# Patient Record
Sex: Male | Born: 1965 | Race: White | Hispanic: No | Marital: Married | Smoking: Current some day smoker
Health system: Southern US, Community
[De-identification: ages and names within clinical notes are randomized; demographics above are authoritative.]

## PROBLEM LIST (undated history)

## (undated) HISTORY — PX: HERNIA REPAIR: SHX51

---

## 1997-12-21 ENCOUNTER — Emergency Department (HOSPITAL_COMMUNITY): Admission: EM | Admit: 1997-12-21 | Discharge: 1997-12-21 | Payer: Self-pay | Admitting: Emergency Medicine

## 1998-07-10 ENCOUNTER — Emergency Department (HOSPITAL_COMMUNITY): Admission: EM | Admit: 1998-07-10 | Discharge: 1998-07-10 | Payer: Self-pay | Admitting: Emergency Medicine

## 1998-07-31 ENCOUNTER — Encounter: Payer: Self-pay | Admitting: Internal Medicine

## 1998-07-31 ENCOUNTER — Emergency Department (HOSPITAL_COMMUNITY): Admission: EM | Admit: 1998-07-31 | Discharge: 1998-07-31 | Payer: Self-pay | Admitting: Internal Medicine

## 1998-09-14 ENCOUNTER — Emergency Department (HOSPITAL_COMMUNITY): Admission: EM | Admit: 1998-09-14 | Discharge: 1998-09-14 | Payer: Self-pay | Admitting: Emergency Medicine

## 1999-03-28 ENCOUNTER — Emergency Department (HOSPITAL_COMMUNITY): Admission: EM | Admit: 1999-03-28 | Discharge: 1999-03-28 | Payer: Self-pay | Admitting: Emergency Medicine

## 1999-03-28 ENCOUNTER — Encounter: Payer: Self-pay | Admitting: Emergency Medicine

## 2000-05-07 ENCOUNTER — Emergency Department (HOSPITAL_COMMUNITY): Admission: EM | Admit: 2000-05-07 | Discharge: 2000-05-07 | Payer: Self-pay

## 2000-11-15 ENCOUNTER — Ambulatory Visit (HOSPITAL_BASED_OUTPATIENT_CLINIC_OR_DEPARTMENT_OTHER): Admission: RE | Admit: 2000-11-15 | Discharge: 2000-11-15 | Payer: Self-pay | Admitting: General Surgery

## 2002-02-15 ENCOUNTER — Emergency Department (HOSPITAL_COMMUNITY): Admission: EM | Admit: 2002-02-15 | Discharge: 2002-02-15 | Payer: Self-pay | Admitting: Emergency Medicine

## 2002-03-23 ENCOUNTER — Emergency Department (HOSPITAL_COMMUNITY): Admission: EM | Admit: 2002-03-23 | Discharge: 2002-03-23 | Payer: Self-pay | Admitting: Emergency Medicine

## 2002-03-26 ENCOUNTER — Encounter: Admission: RE | Admit: 2002-03-26 | Discharge: 2002-03-26 | Payer: Self-pay | Admitting: *Deleted

## 2002-10-09 ENCOUNTER — Encounter: Payer: Self-pay | Admitting: Emergency Medicine

## 2002-10-09 ENCOUNTER — Emergency Department (HOSPITAL_COMMUNITY): Admission: EM | Admit: 2002-10-09 | Discharge: 2002-10-09 | Payer: Self-pay | Admitting: Emergency Medicine

## 2003-07-24 ENCOUNTER — Emergency Department (HOSPITAL_COMMUNITY): Admission: EM | Admit: 2003-07-24 | Discharge: 2003-07-24 | Payer: Self-pay | Admitting: Emergency Medicine

## 2004-02-28 ENCOUNTER — Emergency Department (HOSPITAL_COMMUNITY): Admission: EM | Admit: 2004-02-28 | Discharge: 2004-02-28 | Payer: Self-pay | Admitting: Emergency Medicine

## 2006-01-09 ENCOUNTER — Emergency Department (HOSPITAL_COMMUNITY): Admission: EM | Admit: 2006-01-09 | Discharge: 2006-01-09 | Payer: Self-pay | Admitting: Emergency Medicine

## 2006-05-29 ENCOUNTER — Emergency Department (HOSPITAL_COMMUNITY): Admission: EM | Admit: 2006-05-29 | Discharge: 2006-05-30 | Payer: Self-pay | Admitting: Emergency Medicine

## 2007-07-12 ENCOUNTER — Emergency Department (HOSPITAL_COMMUNITY): Admission: EM | Admit: 2007-07-12 | Discharge: 2007-07-12 | Payer: Self-pay | Admitting: Emergency Medicine

## 2007-12-31 ENCOUNTER — Emergency Department (HOSPITAL_COMMUNITY): Admission: EM | Admit: 2007-12-31 | Discharge: 2007-12-31 | Payer: Self-pay | Admitting: Emergency Medicine

## 2009-02-09 ENCOUNTER — Emergency Department (HOSPITAL_COMMUNITY): Admission: EM | Admit: 2009-02-09 | Discharge: 2009-02-09 | Payer: Self-pay | Admitting: Emergency Medicine

## 2009-04-29 ENCOUNTER — Emergency Department (HOSPITAL_BASED_OUTPATIENT_CLINIC_OR_DEPARTMENT_OTHER): Admission: EM | Admit: 2009-04-29 | Discharge: 2009-04-30 | Payer: Self-pay | Admitting: Emergency Medicine

## 2009-04-29 ENCOUNTER — Ambulatory Visit: Payer: Self-pay | Admitting: Radiology

## 2009-05-03 ENCOUNTER — Emergency Department (HOSPITAL_BASED_OUTPATIENT_CLINIC_OR_DEPARTMENT_OTHER): Admission: EM | Admit: 2009-05-03 | Discharge: 2009-05-03 | Payer: Self-pay | Admitting: Emergency Medicine

## 2010-02-23 IMAGING — CT CT CERVICAL SPINE W/O CM
3 of 4 series · 13 of 33 positions shown, 16 images · non-contrast
Comparison: None available

CLINICAL DATA: Status post fall.  The posterior neck pain.

CT CERVICAL SPINE WITHOUT CONTRAST
TECHNIQUE: Multidetector CT imaging of the cervical spine was
performed. Multiplanar CT image reconstructions were also
generated.

[Series 3: c_spine 2.0 b41s st · axial · 0.24mm/px · z∈[-264,-152]mm · 5 of 84 slices shown, 7 images]
[im 14/84  soft-tissue]
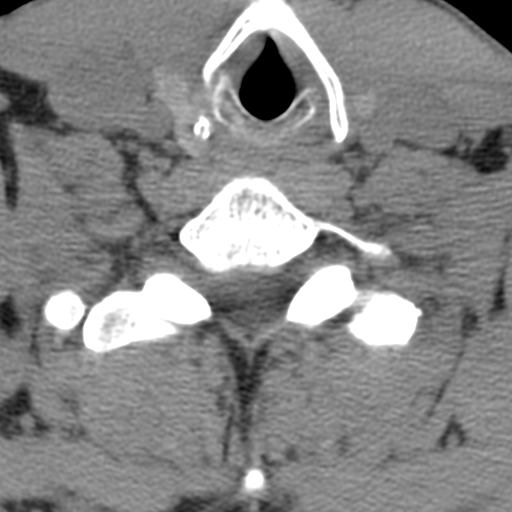
[im 14/84  bone]
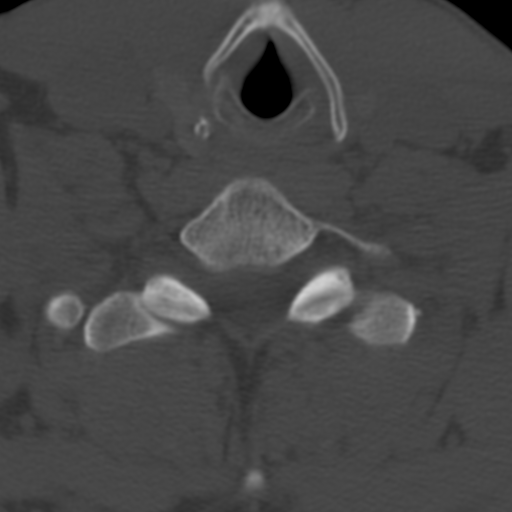
[im 28/84  bone]
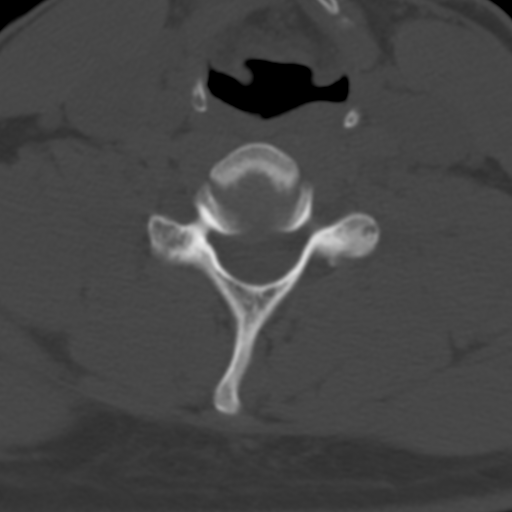
[im 42/84  bone]
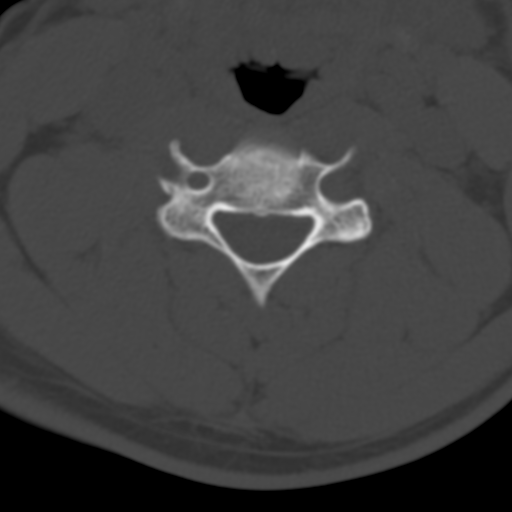
[im 56/84  bone]
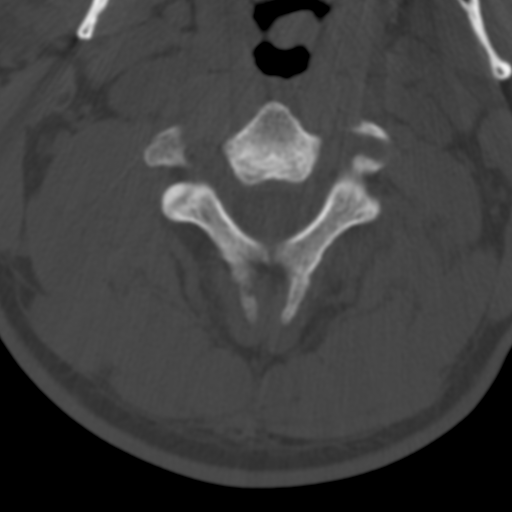
[im 70/84  soft-tissue]
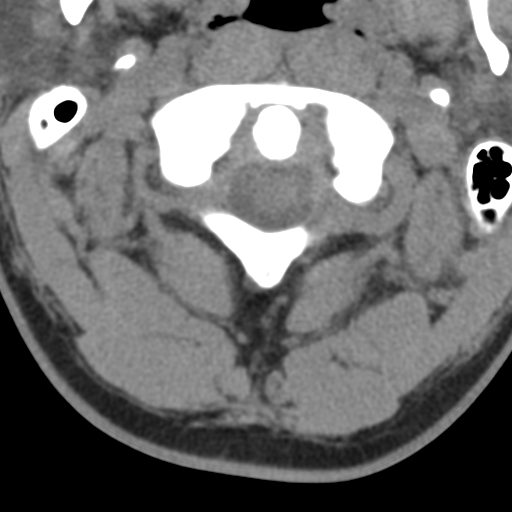
[im 70/84  bone]
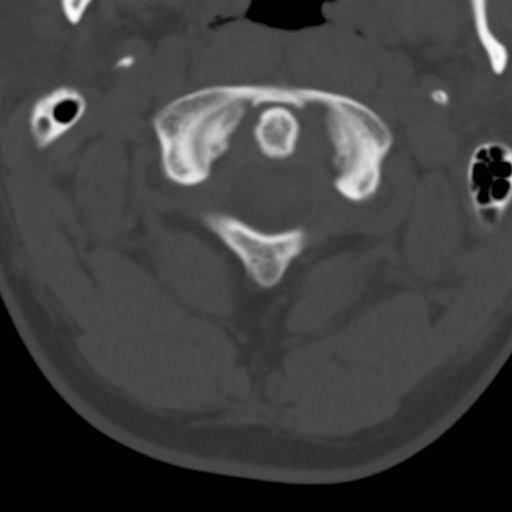

[Series 6: c_spine 2.0 coronal · coronal · 0.24mm/px · 3 of 53 slices shown]
[im 11/53  bone]
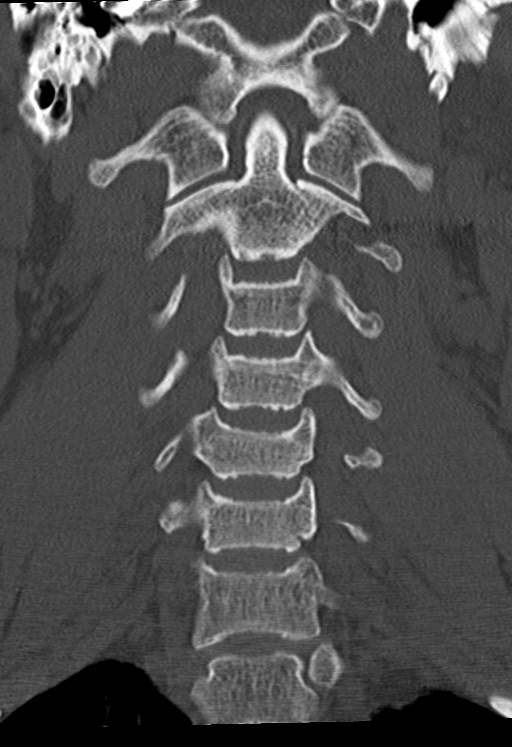
[im 21/53  bone]
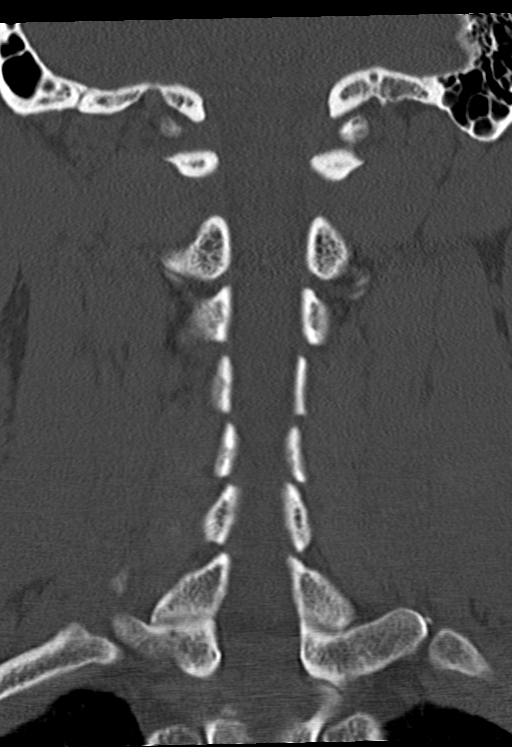
[im 32/53  bone]
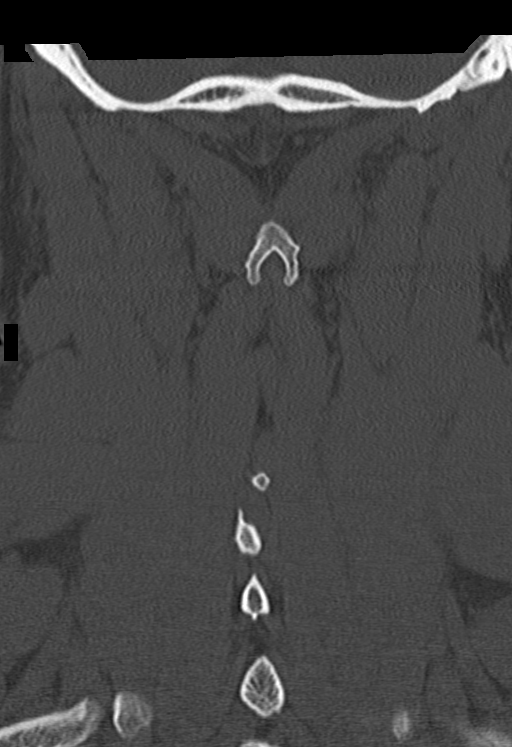

[Series 7: c_spine 2.0 sagittal · sagittal · 0.25mm/px · 5 of 51 slices shown, 6 images]
[im 17/51  bone]
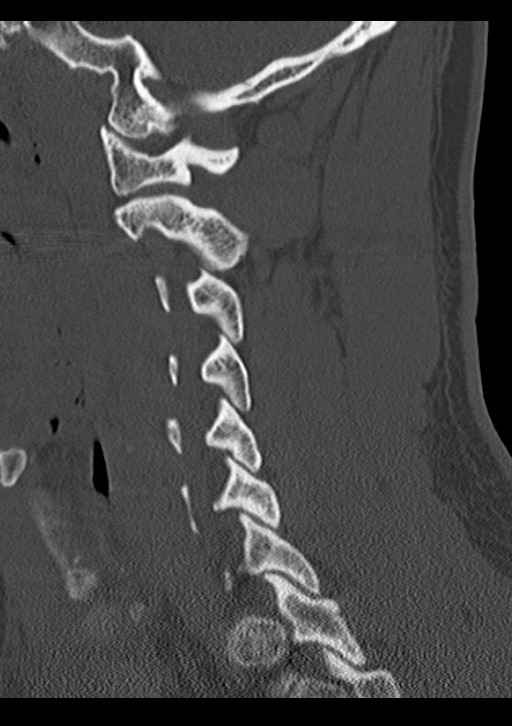
[im 21/51  bone]
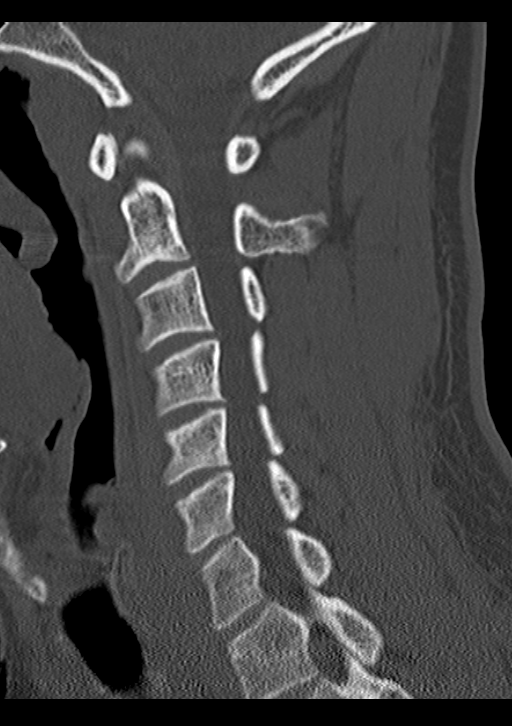
[im 26/51  soft-tissue]
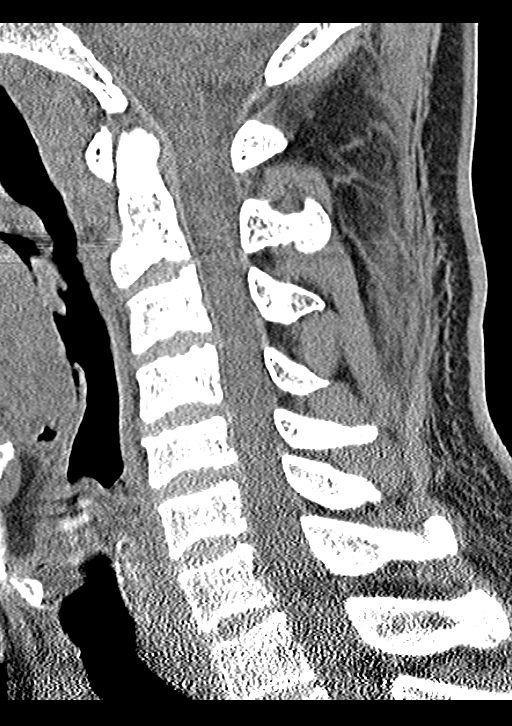
[im 26/51  bone]
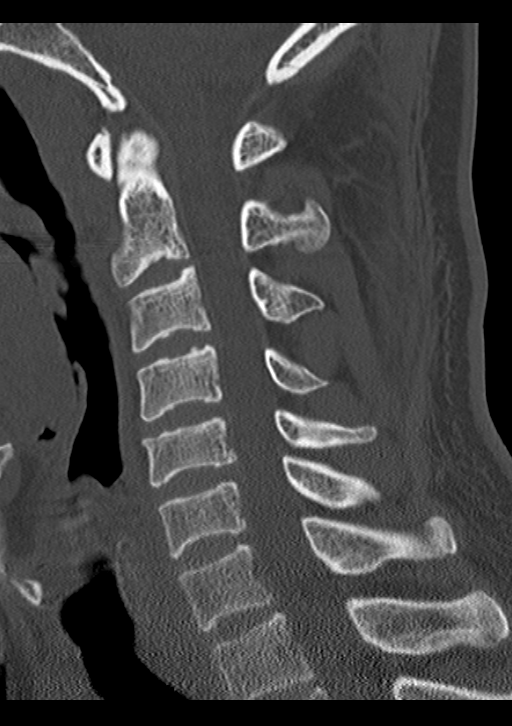
[im 30/51  bone]
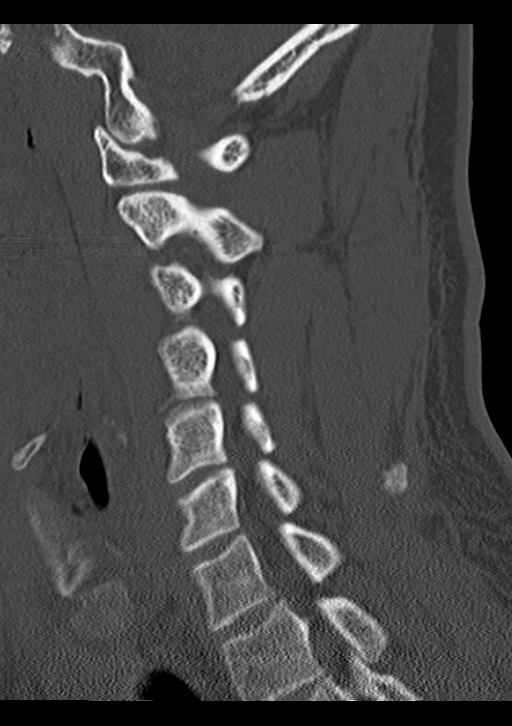
[im 34/51  bone]
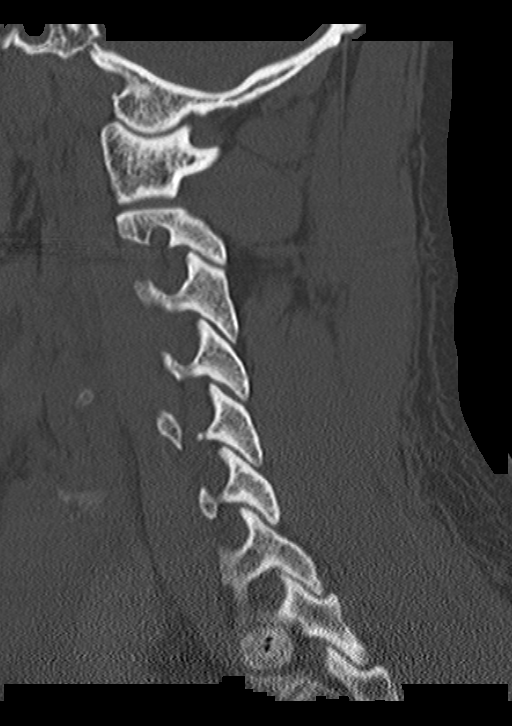

[13 of 33 positions shown; findings below may reference images not displayed]

FINDINGS: The cervical spine is imaged from skull base through the
inferior aspect of T1.  There is chronic loss of vertebral body
height at C5 and to a lesser extent at C4 and C6.  There is no
evidence for acute fracture or traumatic subluxation.  Alignment is
anatomic.  The soft tissues are within normal limits.
IMPRESSION: 1.  No acute fracture or traumatic subluxation.
2.  Minimal degenerative change.

## 2010-11-12 LAB — DIFFERENTIAL
Basophils Absolute: 0.1 10*3/uL (ref 0.0–0.1)
Basophils Relative: 1 % (ref 0–1)
Monocytes Relative: 10 % (ref 3–12)
Neutro Abs: 4.5 10*3/uL (ref 1.7–7.7)
Neutrophils Relative %: 52 % (ref 43–77)

## 2010-11-12 LAB — CBC
MCHC: 35.3 g/dL (ref 30.0–36.0)
Platelets: 269 10*3/uL (ref 150–400)
RBC: 4.56 MIL/uL (ref 4.22–5.81)

## 2010-11-12 LAB — BASIC METABOLIC PANEL
BUN: 18 mg/dL (ref 6–23)
CO2: 30 mEq/L (ref 19–32)
Calcium: 9.7 mg/dL (ref 8.4–10.5)
Creatinine, Ser: 1.3 mg/dL (ref 0.4–1.5)
GFR calc Af Amer: >60 mL/min (ref >60)
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
## 2010-12-24 NOTE — Op Note (Signed)
[facility]  Patient:    Frank Tyler, Frank Tyler                  MRN: [id number] Proc. Date: 11/15/00 Adm. Date:  11/15/00 Attending:  Marnee Spring. Wiliam Ke, M.D.                           Operative Report  PREOPERATIVE DIAGNOSIS:  Recurrent right inguinal hernia.  POSTOPERATIVE DIAGNOSIS:  Recurrent right inguinal hernia.  OPERATION:  Repair of recurrent right inguinal hernia.  SURGEON:  Marnee Spring. Wiliam Ke, M.D.  ASSISTANT:  None.  ANESTHESIA:  General by hospital.  INDICATIONS:  DESCRIPTION OF PROCEDURE:  Under good sedation, the skin of the groin was prepped and draped in the usual manner.  The previously used incision was reopened.  The external ring was identified.  The external oblique aponeurosis was opened thus entering the inguinal canal.  The ilioinguinal nerve was identified and spared.  The spermatic cord was held aside with a Penrose drain.  There was no indirect hernia but there was a sizeable direct hernia. This was repaired with a Marlex patch.  The patch was fashioned and sewn in place with 2 running 2-0 Novofil sutures, both starting at pubic tubercle, one running up conjoint tendon, one running up shelving portion of Pouparts ligament.  Both were tied at the internal ring and then a collaring suture of 2-0 Novofil was used.  Hemostasis was good.  The external oblique aponeurosis was closed superficial to the cord with 2-0 Vicryl, 3-0 Vicryl was used in Scarpas fascia.  Skin was closed with subcuticular 4-0 Vicryl and Steri-Strips applied.  ESTIMATED BLOOD LOSS:  Minimal.  FLUIDS REPLACED:  The patient received no blood.  DISPOSITION:  He left the operating room in satisfactory condition after sponge and needle counts were verified and the testicle was found to be well in the scrotum. DD:  11/15/00 TD:  11/15/00 Job: [id number]

## 2011-09-27 ENCOUNTER — Encounter (HOSPITAL_BASED_OUTPATIENT_CLINIC_OR_DEPARTMENT_OTHER): Payer: Self-pay | Admitting: *Deleted

## 2011-09-27 ENCOUNTER — Emergency Department (HOSPITAL_BASED_OUTPATIENT_CLINIC_OR_DEPARTMENT_OTHER)
Admission: EM | Admit: 2011-09-27 | Discharge: 2011-09-27 | Disposition: A | Discharge status: Home / Self Care | Payer: Self-pay | Attending: Emergency Medicine | Admitting: Emergency Medicine

## 2011-09-27 DIAGNOSIS — R109 Unspecified abdominal pain: Secondary | ICD-10-CM | POA: Insufficient documentation

## 2011-09-27 DIAGNOSIS — F1010 Alcohol abuse, uncomplicated: Secondary | ICD-10-CM | POA: Insufficient documentation

## 2011-09-27 MED ORDER — GABAPENTIN 300 MG PO CAPS: ORAL_CAPSULE | ORAL | Status: DC

## 2011-09-27 NOTE — ED Notes (Signed)
Pt amb to room 3 with quick steady gait in nad. Pt reports 3 months of feeling like his hernia surgical repair "is poking me". abd is soft, non-tender to palp, pt asks this rn to palpate one particular area and states "can you feel the corner of the mesh sticking out?" no foreign bodies or firmness felt by this rn with palpation. Pt denies any fevers, pain, n/v/d or other c/o.

## 2011-09-27 NOTE — ED Provider Notes (Signed)
History     CSN: [id number]  Arrival date & time 09/27/11  1206   First MD Initiated Contact with Patient 09/27/11 1219      Chief Complaint  Patient presents with  . Abdominal Cramping     The history is provided by the patient.   the patient reports chronic pain in his right lower groin since having hernia surgery 1996.  He reports he has discomfort even with light touch of his right lower quadrant.  He reports when his children touch his right lower quadrant or worsened.  It seems to hurt.  He denies nausea and vomiting.  His had no diarrhea.  He denies fever and chills.  He reports his hernia is repaired with mesh.  He saw a general surgeon along time ago he thought it was secondary to nerve root growth issues.  He has not seen anyone in a very long time regarding this.  He does not have a primary care Dr.  He smokes marijuana night to help with the pain so that he can sleep.  He does not like taking pain pills.  His discomfort at this time is minimal.  He denies rash.  He denies testicular pain or blood in his ejaculate.  He reports sometimes when having intercourse is unable to ejaculate.  History reviewed. No pertinent past medical history.  History reviewed. No pertinent past surgical history.  History reviewed. No pertinent family history.  History  Substance Use Topics  . Smoking status: Never Smoker   . Smokeless tobacco: Not on file  . Alcohol Use: Yes      Review of Systems  All other systems reviewed and are negative.    Allergies  Penicillins  Home Medications   Current Outpatient Rx  Name Route Sig Dispense Refill  . GABAPENTIN 300 MG PO CAPS  1 cap PO qday x 1 week, 1 cap PO BID x 1 week, then 1 cap PO TID 120 capsule 1    BP 130/81  Pulse 89  Temp(Src) 98.4 F (36.9 C) (Oral)  Resp 20  SpO2 99%  Physical Exam  Nursing note and vitals reviewed. Constitutional: He is oriented to person, place, and time. He appears well-developed and  well-nourished.  HENT:  Head: Normocephalic and atraumatic.  Eyes: EOM are normal.  Neck: Normal range of motion.  Pulmonary/Chest: No respiratory distress.  Abdominal: Soft. He exhibits no distension. There is no tenderness.  Genitourinary:       Normal circumcised penis.  Normal testicles.  No testicular mass.  No right inguinal mass.  No hernias palpated.  Healed hernia scar in right lower quadrant  Musculoskeletal: Normal range of motion.  Neurological: He is alert and oriented to person, place, and time.  Skin: Skin is warm and dry.  Psychiatric: He has a normal mood and affect. Judgment normal.    ED Course  Procedures (including critical care time)  Labs Reviewed - No data to display No results found.   1. Abdominal pain       MDM  Patient does have chronic pain in his right groin since his hernia surgery approximately 15 years ago.  A lot of his pain sounds neuropathic in nature.  There is no hernia on exam.  The patient will be started on Neurontin.  I will give the patient general surgery followup as he does have mesh in his abdomen.  He reports the pain has been present for several years and is only worsened over the past  4-6 months to the point where he needs to smoke marijuana at night to help him sleep.        Lyanne Co, MD 09/27/11 1300

## 2011-09-27 NOTE — Discharge Instructions (Signed)

## 2011-10-05 ENCOUNTER — Encounter (INDEPENDENT_AMBULATORY_CARE_PROVIDER_SITE_OTHER): Payer: Self-pay | Admitting: Surgery

## 2011-10-11 ENCOUNTER — Ambulatory Visit (INDEPENDENT_AMBULATORY_CARE_PROVIDER_SITE_OTHER): Payer: Self-pay | Admitting: Surgery

## 2011-10-25 ENCOUNTER — Encounter (INDEPENDENT_AMBULATORY_CARE_PROVIDER_SITE_OTHER): Payer: Self-pay | Admitting: Surgery

## 2016-03-07 ENCOUNTER — Emergency Department (HOSPITAL_BASED_OUTPATIENT_CLINIC_OR_DEPARTMENT_OTHER)
Admission: EM | Admit: 2016-03-07 | Discharge: 2016-03-07 | Disposition: A | Discharge status: Home / Self Care | Payer: Self-pay | Attending: Emergency Medicine | Admitting: Emergency Medicine

## 2016-03-07 ENCOUNTER — Encounter (HOSPITAL_BASED_OUTPATIENT_CLINIC_OR_DEPARTMENT_OTHER): Payer: Self-pay | Admitting: *Deleted

## 2016-03-07 DIAGNOSIS — M5417 Radiculopathy, lumbosacral region: Secondary | ICD-10-CM | POA: Insufficient documentation

## 2016-03-07 MED ORDER — PREDNISONE 20 MG PO TABS: ORAL_TABLET | ORAL | 0 refills | Status: DC

## 2016-03-07 MED ORDER — NAPROXEN 500 MG PO TABS: 500.0000 mg | ORAL_TABLET | Freq: Two times a day (BID) | ORAL | 0 refills | Status: DC

## 2016-03-07 MED ORDER — CYCLOBENZAPRINE HCL 10 MG PO TABS: 10.0000 mg | ORAL_TABLET | Freq: Two times a day (BID) | ORAL | 0 refills | Status: DC | PRN

## 2016-03-07 NOTE — ED Triage Notes (Signed)
Back pain with sharp stabbing pain down his left leg. Pain started after doing flips off the diving board.

## 2016-03-07 NOTE — ED Provider Notes (Signed)
[facility]   CSN: [id number] Arrival date & time: 03/07/16  2057  First Provider Contact:   First MD Initiated Contact with Patient 03/07/16 2240      By signing my name below, I, Soijett Blue, attest that this documentation has been prepared under the direction and in the presence of Fayrene Helper, PA-C Electronically Signed: Soijett Blue, ED Scribe. 03/07/16. 10:49 PM.   History   Chief Complaint Chief Complaint  Patient presents with  . Back Pain    HPI Frank Tyler is a 50 y.o. male who presents to the Emergency Department complaining of intermittent back pain onset 9 days. Pt notes that he went diving off a diving board several times prior to the onset of his symptoms. Pt reports that he had gradual onset of lower back pain the following morning. Pt notes that his lower back pain radiates to his left leg and he describes his lower back pain as a stabbing sensation. Pt states that he back pain is worsened with prolonged sitting and alleviated with ambulation.   He states that he has tried flexeril, Roxicodone, naprosyn with no relief for his symptoms. Pt notes that he obtain the medications that he used from his brothers. Pt denies bowel/bladder incontinence, fever, gait problem, color change, rash, wound, and any other symptoms, Denies CA or IV drug use.   The history is provided by the patient. No language interpreter was used.    History reviewed. No pertinent past medical history.  There are no active problems to display for this patient.   Past Surgical History:  Procedure Laterality Date  . HERNIA REPAIR     RIH  . HERNIA REPAIR     as a child       Home Medications    Prior to Admission medications   Medication Sig Start Date End Date Taking? Authorizing Provider  gabapentin (NEURONTIN) 300 MG capsule 1 cap PO qday x 1 week, 1 cap PO BID x 1 week, then 1 cap PO TID 09/27/11   Azalia Bilis, MD    Family History No family  history on file.  Social History Social History  Substance Use Topics  . Smoking status: Never Smoker  . Smokeless tobacco: Not on file  . Alcohol use Yes     Allergies   Penicillins   Review of Systems Review of Systems  Gastrointestinal:       No bowel incontinence.  Genitourinary:       No bladder incontinence.  Musculoskeletal: Positive for back pain. Negative for gait problem.  Skin: Negative for color change.     Physical Exam Updated Vital Signs BP 149/97   Pulse 83   Temp 98.2 F (36.8 C) (Oral)   Resp 20   Ht 5\' 11"  (1.803 m)   Wt 194 lb (88 kg)   SpO2 98%   BMI 27.06 kg/m   Physical Exam  Constitutional: He is oriented to person, place, and time. He appears well-developed and well-nourished. No distress.  HENT:  Head: Normocephalic and atraumatic.  Eyes: EOM are normal.  Neck: Neck supple.  Cardiovascular: Normal rate, regular rhythm and normal heart sounds.  Exam reveals no gallop and no friction rub.   No murmur heard. Pulmonary/Chest: Effort normal and breath sounds normal. No respiratory distress. He has no wheezes. He has no rales.  Abdominal: Soft. He exhibits no distension. There is no tenderness. There is no CVA tenderness.  No CVA tenderness  Musculoskeletal: Normal  range of motion. He exhibits tenderness.       Cervical back: Normal.       Thoracic back: Normal.       Lumbar back: Normal.  No significant midline spinal tenderness, crepitus, or step-offs. Tenderness noted to left lumbosacral region on palpation with +SLR on left side. Dorsalis pedis pulses palpable bilaterally.   Neurological: He is alert and oriented to person, place, and time. Gait normal.  Patellar reflexes intact bilaterally. Able to ambulate without difficulty or assistance.   Skin: Skin is warm and dry.  Psychiatric: He has a normal mood and affect. His behavior is normal.  Nursing note and vitals reviewed.    ED Treatments / Results  DIAGNOSTIC STUDIES: Oxygen  Saturation is 98% on RA, nl by my interpretation.    COORDINATION OF CARE: 10:47 PM Discussed treatment plan with pt at bedside which includes naprosyn Rx, steroid Rx, referral and follow up with orthopedist, and pt agreed to plan.   Procedures Procedures (including critical care time)  Medications Ordered in ED Medications - No data to display   Initial Impression / Assessment and Plan / ED Course  I have reviewed the triage vital signs and the nursing notes.  Pertinent labs & imaging results that were available during my care of the patient were reviewed by me and considered in my medical decision making (see chart for details).  Clinical Course    BP 140/92 (BP Location: Right Arm)   Pulse 92   Temp 98.2 F (36.8 C) (Oral)   Resp 18   Ht  (1.803 m)   Wt 88 kg   SpO2 99%   BMI 27.06 kg/m    Final Clinical Impressions(s) / ED Diagnoses   Final diagnoses:  Lumbosacral radiculopathy    New Prescriptions Discharge Medication List as of 03/07/2016 10:53 PM    START taking these medications   Details  cyclobenzaprine (FLEXERIL) 10 MG tablet Take 1 tablet (10 mg total) by mouth 2 (two) times daily as needed for muscle spasms., Starting Mon 03/07/2016, Print    naproxen (NAPROSYN) 500 MG tablet Take 1 tablet (500 mg total) by mouth 2 (two) times daily., Starting Mon 03/07/2016, Print    predniSONE (DELTASONE) 20 MG tablet 3 tabs po day one, then 2 tabs daily x 4 days, Print        I personally performed the services described in this documentation, which was scribed in my presence. The recorded information has been reviewed and is accurate.       Fayrene Helper, PA-C 03/08/16 1607    Rolland Porter, MD 03/19/16 2234

## 2017-10-08 ENCOUNTER — Encounter (HOSPITAL_BASED_OUTPATIENT_CLINIC_OR_DEPARTMENT_OTHER): Payer: Self-pay | Admitting: *Deleted

## 2017-10-08 ENCOUNTER — Other Ambulatory Visit: Payer: Self-pay

## 2017-10-08 DIAGNOSIS — Y999 Unspecified external cause status: Secondary | ICD-10-CM | POA: Insufficient documentation

## 2017-10-08 DIAGNOSIS — Y92018 Other place in single-family (private) house as the place of occurrence of the external cause: Secondary | ICD-10-CM | POA: Insufficient documentation

## 2017-10-08 DIAGNOSIS — F17290 Nicotine dependence, other tobacco product, uncomplicated: Secondary | ICD-10-CM | POA: Insufficient documentation

## 2017-10-08 DIAGNOSIS — W34010A Accidental discharge of airgun, initial encounter: Secondary | ICD-10-CM | POA: Insufficient documentation

## 2017-10-08 DIAGNOSIS — Y9389 Activity, other specified: Secondary | ICD-10-CM | POA: Insufficient documentation

## 2017-10-08 DIAGNOSIS — S40852A Superficial foreign body of left upper arm, initial encounter: Secondary | ICD-10-CM | POA: Insufficient documentation
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
## 2017-10-09 ENCOUNTER — Emergency Department (HOSPITAL_BASED_OUTPATIENT_CLINIC_OR_DEPARTMENT_OTHER)
Admission: EM | Admit: 2017-10-09 | Discharge: 2017-10-09 | Disposition: A | Discharge status: Home / Self Care | Payer: Self-pay | Attending: Emergency Medicine | Admitting: Emergency Medicine

## 2017-10-09 DIAGNOSIS — S40852A Superficial foreign body of left upper arm, initial encounter: Secondary | ICD-10-CM

## 2017-10-09 NOTE — ED Notes (Signed)
Pt given d/c instructions as per chart. Verbalizes understanding. No questions. 

## 2017-10-09 NOTE — ED Notes (Signed)
ED Provider at bedside. 
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
## 2018-03-13 ENCOUNTER — Other Ambulatory Visit: Payer: Self-pay

## 2018-03-13 ENCOUNTER — Encounter (HOSPITAL_BASED_OUTPATIENT_CLINIC_OR_DEPARTMENT_OTHER): Payer: Self-pay | Admitting: *Deleted

## 2018-03-13 ENCOUNTER — Emergency Department (HOSPITAL_BASED_OUTPATIENT_CLINIC_OR_DEPARTMENT_OTHER)
Admission: EM | Admit: 2018-03-13 | Discharge: 2018-03-13 | Disposition: A | Discharge status: Home / Self Care | Payer: Self-pay | Attending: Emergency Medicine | Admitting: Emergency Medicine

## 2018-03-13 DIAGNOSIS — M5442 Lumbago with sciatica, left side: Secondary | ICD-10-CM | POA: Insufficient documentation

## 2018-03-13 DIAGNOSIS — F17200 Nicotine dependence, unspecified, uncomplicated: Secondary | ICD-10-CM | POA: Insufficient documentation

## 2018-03-13 DIAGNOSIS — Z79899 Other long term (current) drug therapy: Secondary | ICD-10-CM | POA: Insufficient documentation

## 2018-03-13 MED ORDER — PREDNISONE 10 MG PO TABS: 30.0000 mg | ORAL_TABLET | Freq: Every day | ORAL | 0 refills | Status: AC

## 2018-03-13 MED ORDER — METHOCARBAMOL 500 MG PO TABS: 500.0000 mg | ORAL_TABLET | Freq: Two times a day (BID) | ORAL | 0 refills | Status: AC

## 2018-03-13 MED ORDER — NAPROXEN 500 MG PO TABS: 500.0000 mg | ORAL_TABLET | Freq: Two times a day (BID) | ORAL | 0 refills | Status: AC

## 2018-03-13 MED FILL — NAPROXEN 500 MG TABLET: 500 | 7 days supply | Qty: 14 | Fill #0

## 2018-03-13 MED FILL — METHOCARBAMOL 500 MG TABLET: 500 | 5 days supply | Qty: 10 | Fill #0

## 2018-03-13 MED FILL — predniSONE 10 MG TABS: 10 | 5 days supply | Qty: 15 | Fill #0

## 2018-03-13 NOTE — ED Notes (Signed)
ED Provider at bedside. 

## 2018-03-13 NOTE — ED Provider Notes (Signed)
[facility] EMERGENCY DEPARTMENT Provider Note   CSN: [id number] Arrival date & time: 03/13/18  1345     History   Chief Complaint Chief Complaint  Patient presents with  . Back Pain    HPI Frank Tyler is a 52 y.o. male presenting for left leg pain for the past 3 weeks.  Patient states that the pain began the day after he went out golfing, he describes it as a shooting pain that radiates from his left gluteal muscle all the way down to his left foot.  Patient denies injury/falls.  Patient states that he has not taken any medication for his pain but did go get a massage which he feels made the pain worse.  He describes his pain as 7/10 in severity at this time made worse with ambulation.   Patient denies history of fever, trauma, leg swelling/color change, bowel or bladder incontinence, saddle area paresthesias.  Patient states that he has had similar pain in the past approximately 2-3 years ago.  He states at that time he was prescribed medicines in the emergency department which helped with his pain.  I have reviewed the patient's chart which shows seen on 03/07/2016 for similar symptoms prescribed muscle relaxer, naproxen and prednisone.  HPI  History reviewed. No pertinent past medical history.  There are no active problems to display for this patient.   Past Surgical History:  Procedure Laterality Date  . HERNIA REPAIR     RIH  . HERNIA REPAIR     as a child        Home Medications    Prior to Admission medications   Medication Sig Start Date End Date Taking? Authorizing Provider  gabapentin (NEURONTIN) 300 MG capsule 1 cap PO qday x 1 week, 1 cap PO BID x 1 week, then 1 cap PO TID 09/27/11   Azalia Bilis, MD  methocarbamol (ROBAXIN) 500 MG tablet Take 1 tablet (500 mg total) by mouth 2 (two) times daily. 03/13/18   Harlene Salts A, PA-C  naproxen (NAPROSYN) 500 MG tablet Take 1 tablet (500 mg total) by mouth 2 (two) times daily. 03/13/18   Harlene Salts A, PA-C  predniSONE (DELTASONE) 10 MG tablet Take 3 tablets (30 mg total) by mouth daily for 5 days. 03/13/18 03/18/18  Bill Salinas, PA-C    Family History History reviewed. No pertinent family history.  Social History Social History   Tobacco Use  . Smoking status: Current Every Day Smoker    Packs/day: 0.50  . Smokeless tobacco: Current User  Substance Use Topics  . Alcohol use: Yes    Comment: social  . Drug use: Yes    Types: Marijuana     Allergies   Penicillins   Review of Systems Review of Systems  Constitutional: Negative.  Negative for chills, fatigue and fever.  Genitourinary: Negative.  Negative for dysuria.       Denies bowel or bladder incontinence.  Musculoskeletal: Positive for back pain. Negative for neck pain and neck stiffness.  Skin: Negative.  Negative for color change and wound.  Neurological: Negative.  Negative for weakness, numbness and headaches.     Physical Exam Updated Vital Signs BP (!) 126/92 (BP Location: Left Arm)   Pulse 73   Temp 98.9 F (37.2 C)   Resp 16   Ht 5\' 10"  (1.778 m)   Wt 90.7 kg (200 lb)   SpO2 100%   BMI 28.70 kg/m   Physical Exam  Constitutional: He  is oriented to person, place, and time. He appears well-developed and well-nourished. No distress.  HENT:  Head: Normocephalic and atraumatic.  Right Ear: External ear normal.  Left Ear: External ear normal.  Nose: Nose normal.  Eyes: Pupils are equal, round, and reactive to light. EOM are normal.  Neck: Trachea normal and normal range of motion. No tracheal deviation present.  Pulmonary/Chest: Effort normal. No respiratory distress.  Abdominal: Soft. There is no tenderness. There is no rebound and no guarding.  Musculoskeletal: Normal range of motion.       Cervical back: Normal.       Thoracic back: Normal.       Lumbar back: He exhibits normal range of motion, no bony tenderness, no swelling and no deformity.       Back:  No midline spinal  tenderness to palpation, no paraspinal muscle tenderness, no deformity, crepitus, or step-off noted  Patient with tenderness to palpation of the left gluteal muscle. Positive left-sided straight leg test.  Patient is neurovascularly intact to both lower externally's bilaterally.  Strong and equal pedal pulses.  No lower extremity swelling or color change.  Full sensation to both lower extremities.  5/5 strength to dorsi and plantar flexion as well as extension and flexion of the left knee.  Sensation intact to bilateral lower extremities.  Patient ambulatory without assistance, normal gait.  Neurological: He is alert and oriented to person, place, and time. No sensory deficit.  Skin: Skin is warm and dry.  Psychiatric: He has a normal mood and affect. His behavior is normal.     ED Treatments / Results  Labs (all labs ordered are listed, but only abnormal results are displayed) Labs Reviewed - No data to display  EKG None  Radiology No results found.  Procedures Procedures (including critical care time)  Medications Ordered in ED Medications - No data to display   Initial Impression / Assessment and Plan / ED Course  I have reviewed the triage vital signs and the nursing notes.  Pertinent labs & imaging results that were available during my care of the patient were reviewed by me and considered in my medical decision making (see chart for details).  Patient presenting with left-sided back pain with left-sided sciatica. No neurological deficits and normal neuro exam.  Patient can walk but states is painful.  Patient denies loss of bowel/bladder control or saddle area paresthesias.  No concern for cauda equina.  No fever, night sweats, weight loss, h/o cancer, or IVDU. RICE protocol and pain medicine indicated and discussed with patient.   Naproxen 500mg  BID prescribed. Patient denies history of CKD or gastric ulcers/bleeding. Robaxin 500mg  BID prescribed. Patient informed to  avoid driving or operating heavy machinery while taking muscle relaxer.  Patient also prescribed a short course of prednisone, patient denies history of diabetes, states he has taken prednisone before with no adverse reaction.  At this time there does not appear to be any evidence of an acute emergency medical condition and the patient appears stable for discharge with appropriate outpatient follow up. Diagnosis was discussed with patient who verbalizes understanding of care plan and is agreeable to discharge. I have discussed return precautions with patient who verbalizes understanding of return precautions. Patient strongly encouraged to follow-up with their PCP. All questions answered.  Patient's case discussed with Dr. Jacqulyn Bath who agrees with plan to discharge with follow-up.     Note: Portions of this report may have been transcribed using voice recognition software. Every effort was  made to ensure accuracy; however, inadvertent computerized transcription errors may still be present.       Final Clinical Impressions(s) / ED Diagnoses   Final diagnoses:  Acute left-sided low back pain with left-sided sciatica    ED Discharge Orders        Ordered    naproxen (NAPROSYN) 500 MG tablet  2 times daily     03/13/18 1428    methocarbamol (ROBAXIN) 500 MG tablet  2 times daily     03/13/18 1428    predniSONE (DELTASONE) 10 MG tablet  Daily     03/13/18 1428       Bill SalinasMorelli, Shyloh Krinke A, PA-C 03/13/18 1504    Maia PlanLong, Joshua G, MD 03/13/18 1729

## 2018-03-13 NOTE — ED Triage Notes (Signed)
Pt co left lower back pain x 1 week

## 2018-03-13 NOTE — Discharge Instructions (Addendum)
Please follow-up with your primary care provider regarding your visit today.  If you do not have a primary care provider please use the resources below to establish one. Please return to the emergency department for any new or worsening symptoms or if your symptoms do not improve. Please use the medicine as prescribed to help with your pain.  Please remember not to drive or operate heavy machinery while taking Robaxin because it will make you sleepy.  Please also drink plenty of water while taking naproxen.  Contact a doctor if: You have pain that: Wakes you up when you are sleeping. Gets worse when you lie down. Is worse than the pain you have had in the past. Lasts longer than 4 weeks. You lose weight for without trying. Get help right away if: You cannot control when you pee (urinate) or poop (have a bowel movement). You have weakness in any of these areas and it gets worse. Lower back. Lower belly (pelvis). Butt (buttocks). Legs. You have redness or swelling of your back. You have a burning feeling when you pee.  RESOURCE GUIDE  Chronic Pain Problems: Contact [facility]  [phone] Patients need to be referred by their primary care doctor.  Insufficient Money for Medicine: Contact United Way:  call "211" or [facility] [phone].  No Primary Care Doctor: Call Health Connect  [phone] - can help you locate a primary care doctor that  accepts your insurance, provides certain services, etc. Physician Referral Service- [phone]  Agencies that provide inexpensive medical care: [facility]  [phone] [facility]  [phone] [facility]  [phone] [facility]  [phone] [facility]  [phone] [facility]  [phone]  Medicaid-accepting [facility] Providers: [facility]- [address], [address]  [phone], Mon-Fri 9am-7pm, Sat 9am-1pm [facility]- [address], [address] [place]  [phone] [facility]- [address], [address] [place]  [phone] [facility]- [address] [place] [address]  [phone] Renaye RakersVeita Bland- [address], [address], [phone]  Only accepts [place] patients after they have their name  applied to their card  Self Pay (no insurance) in [facility]: Sickle Cell Patients: Dr Willey BladeEric Dean, [facility]  [address], [phone] [facility]- [address]  [phone]       Redge Gainer-     [facility]- [address], [address]       -     [facility]- see information above (Speak to CitigroupPam H if you do not have insurance)       -  [facility]- [address], [phone]       -  [facility] [place]- [address],  [phone]       -  [facility]- [address] [place] [address], [phone]       -  Dr Julio Sickssei-Bonsu-  [address], [address], [place], [phone]       -  [facility]- [address], [phone]       -  [facility]- [address] [place] [address], [phone], also [address], [phone]       -    [facility]- [address], [phone], 1st & 3rd Saturday   every month, 10am-1pm  1) Find  a Doctor and Pay Out of Pocket Although you won't have to find out who is covered by your insurance plan, it is a good idea to ask around and get recommendations. You will then need to call the office and see if the doctor you have chosen will accept you as a new patient and what types of options they offer for patients who are self-pay. Some doctors offer discounts or will set up payment plans for their patients who do not have insurance, but you will need to ask so you aren't surprised when you get to your appointment.  2) Contact Your Local Health Department Not all health departments have doctors that can see patients for sick visits, but many do, so it is  worth a call to see if yours does. If you don't know where your local health department is, you can check in your phone book. The CDC also has a tool to help you locate your state's health department, and many state websites also have listings of all of their local health departments.  3) Find a Walk-in Clinic If your illness is not likely to be very severe or complicated, you may want to try a walk in clinic. These are popping up all over the country in pharmacies, drugstores, and shopping centers. They're usually staffed by nurse practitioners or physician assistants that have been trained to treat common illnesses and complaints. They're usually fairly quick and inexpensive. However, if you have serious medical issues or chronic medical problems, these are probably not your best option  STD Testing [facility] Department of [facility] [place], STD Clinic, [address], [place], phone [phone] or [phone].  Monday - Friday, call for an appointment. [facility] Department of [facility] [place], STD Clinic, [address], [place], phone [phone] or [phone].  Monday - Friday, call for an appointment.  Abuse/Neglect: [facility] Child Abuse Hotline (913)487-3487(336) [phone])668-0720 [facility] Child Abuse Hotline 682 [phone]) [phone] (After Hours)  Emergency Shelter:  [facility] [phone](336) [phone]  Maternity Homes: Room at the [place] [facility] [phone](336) [phone] [facility] [phone]) [phone]  MRSA Hotline #:   (248)459-1974(743)051-2327  [facility] Resources  [facility] [place] [place]  [employer] Way [facility]. [address].                 [address]         [address]  [place]                                               [place]                              [place] Phone:  [phone]                                  Phone:  [phone]                   Phone:  [phone]  [facility],  [phone] [facility] - [phone]- [phone]       -     [facility] in [place], [address],  [phone], [place] [phone] or [phone] (After Hours)   [place]  Substance Abuse Resources: Alcohol and Drug Services  [place] [phone] The Celoron Chinita Pester [phone] Residential & Outpatient Substance Abuse Program  [phone]  Psychological Services: [place]  [phone] [place]  [place], [place] [address] [place], [place]: [phone] or [phone], PicCapture.uy  Dental Assistance  If unable to pay or uninsured, contact:  [facility] or [facility]. to become qualified for the adult dental clinic.  Patients with Medicaid: [facility] [phone] [address], [place] [address], [phone]  If unable to pay, or uninsured, contact [facility] [phone]) or [employer] [phone] in [place], [place] in [facility]) to become qualified for the adult dental clinic   Other [place]- [place], [place], [place], [address], [place], [place], 2nd and 4th Thursday of the month at 6:30am.  10 clients each day by appointment, can sometimes see walk-in patients if someone does not show for an appointment. [facility]- [address] Hillard Danker [place], [place], [address], [place], [place], [place], [address], [place] Department- [phone] [place] [facility] [phone]

## 2018-03-21 ENCOUNTER — Encounter (HOSPITAL_BASED_OUTPATIENT_CLINIC_OR_DEPARTMENT_OTHER): Payer: Self-pay

## 2018-03-21 ENCOUNTER — Other Ambulatory Visit: Payer: Self-pay

## 2018-03-21 ENCOUNTER — Emergency Department (HOSPITAL_BASED_OUTPATIENT_CLINIC_OR_DEPARTMENT_OTHER)
Admission: EM | Admit: 2018-03-21 | Discharge: 2018-03-21 | Disposition: A | Discharge status: Home / Self Care | Payer: Self-pay | Attending: Emergency Medicine | Admitting: Emergency Medicine

## 2018-03-21 DIAGNOSIS — F17210 Nicotine dependence, cigarettes, uncomplicated: Secondary | ICD-10-CM | POA: Insufficient documentation

## 2018-03-21 DIAGNOSIS — Z79899 Other long term (current) drug therapy: Secondary | ICD-10-CM | POA: Insufficient documentation

## 2018-03-21 DIAGNOSIS — M5442 Lumbago with sciatica, left side: Secondary | ICD-10-CM | POA: Insufficient documentation

## 2018-03-21 MED ORDER — CYCLOBENZAPRINE HCL 10 MG PO TABS
10.0000 mg | ORAL_TABLET | Freq: Once | ORAL | Status: AC
  Administered 2018-03-21: 10 mg via ORAL
  Filled 2018-03-21: qty 1

## 2018-03-21 MED ORDER — PREDNISONE 10 MG PO TABS: 20.0000 mg | ORAL_TABLET | Freq: Every day | ORAL | 0 refills | Status: AC

## 2018-03-21 MED ORDER — KETOROLAC TROMETHAMINE 30 MG/ML IJ SOLN
60.0000 mg | Freq: Once | INTRAMUSCULAR | Status: AC
  Administered 2018-03-21: 60 mg via INTRAMUSCULAR
  Filled 2018-03-21: qty 2

## 2018-03-21 NOTE — Discharge Instructions (Addendum)
You have received an injection of Toradol which is an anti-inflammatory and a muscle relaxer. I have prescribed you steroids to take for the next 5 days to help with the inflammation that is causing the pain. You take this in combination with NSAIDs (Tylenol, ibuprofen) and muscle relaxers.  Thank you for allowing me to take care of you today!

## 2018-03-21 NOTE — ED Notes (Signed)
Pt/family verbalized understanding of discharge instructions.   

## 2018-03-21 NOTE — ED Triage Notes (Signed)
Pt c/o sharp pain in L buttocks and L calf. Pt was seen here 2 weeks ago for same pt was dx with sciatica.

## 2018-03-22 NOTE — ED Provider Notes (Signed)
[facility] EMERGENCY DEPARTMENT Provider Note  CSN: [id number] Arrival date & time: 03/21/18  1815    History   Chief Complaint Chief Complaint  Patient presents with  . Hip Pain    HPI Frank Tyler is a 52 y.o. male with a medical history of low back pain with sciatica who presented to the ED for left side back pain x2 days. Associated symptoms: radiating pain to left buttock and down to calf. Denies recent injury, falls or trauma. He describes the pain as sharp, shooting and worse when the patient straightens his leg. He has relief when the leg is bent. He reports being diagnosed with sciatica and states symptoms relieved after a couple of days of prednisone. Patient has tried nothing prior to coming to the ED this time. Denies fever,  paresthesias, weakness, foot drop, bowel/bladder dysfunction or other arthralgias.  Additional history obtained by medical chart. Patient seen in the ED on 03/13/18 for similar complaint. Diagnosed with low back with sciatica and discharged with Naprosyn, Robaxin and prednisone.   History reviewed. No pertinent past medical history.  There are no active problems to display for this patient.   Past Surgical History:  Procedure Laterality Date  . HERNIA REPAIR     RIH  . HERNIA REPAIR     as a child        Home Medications    Prior to Admission medications   Medication Sig Start Date End Date Taking? Authorizing Provider  gabapentin (NEURONTIN) 300 MG capsule 1 cap PO qday x 1 week, 1 cap PO BID x 1 week, then 1 cap PO TID 09/27/11   Azalia Bilisampos, Kevin, MD  methocarbamol (ROBAXIN) 500 MG tablet Take 1 tablet (500 mg total) by mouth 2 (two) times daily. 03/13/18   Harlene SaltsMorelli, Brandon A, PA-C  naproxen (NAPROSYN) 500 MG tablet Take 1 tablet (500 mg total) by mouth 2 (two) times daily. 03/13/18   Harlene SaltsMorelli, Brandon A, PA-C  predniSONE (DELTASONE) 10 MG tablet Take 2 tablets (20 mg total) by mouth daily for 5 days. 03/21/18 03/26/18  Mortis,  Sharyon MedicusGabrielle I, PA-C    Family History No family history on file.  Social History Social History   Tobacco Use  . Smoking status: Current Some Day Smoker    Packs/day: 0.50  . Smokeless tobacco: Current User  Substance Use Topics  . Alcohol use: Yes    Comment: social  . Drug use: Yes    Types: Marijuana     Allergies   Penicillins   Review of Systems Review of Systems  Constitutional: Negative.   Genitourinary: Negative.   Musculoskeletal: Positive for back pain and gait problem. Negative for neck pain and neck stiffness.  Skin: Negative.   Neurological: Negative for weakness and numbness.     Physical Exam Updated Vital Signs BP 133/74 (BP Location: Left Arm)   Pulse 98   Temp 98.5 F (36.9 C) (Oral)   Resp 18   Ht 5\' 10"  (1.778 m)   Wt 90.7 kg   SpO2 98%   BMI 28.70 kg/m   Physical Exam  Constitutional: Vital signs are normal. He appears well-developed and well-nourished. He is cooperative.  Neck: Normal range of motion and full passive range of motion without pain. Neck supple. No spinous process tenderness and no muscular tenderness present. Normal range of motion present.  Cardiovascular:  Pulses:      Dorsalis pedis pulses are 2+ on the right side, and 2+ on the left side.  Posterior tibial pulses are 2+ on the right side, and 2+ on the left side.  Musculoskeletal:       Cervical back: Normal.       Thoracic back: Normal.       Lumbar back: He exhibits decreased range of motion and tenderness. He exhibits no bony tenderness and no spasm.  Lower extremities have full active and passive ROM with 5/5 strength. Significant pain with hip extension. Positive straight leg test on the left. Able to bear weight and ambulate, but patient limits his gait because of pain with hip extension so limp is seen.  Neurological: He is alert. He has normal strength. No sensory deficit. He exhibits normal muscle tone.  Reflex Scores:      Patellar reflexes are 2+ on  the right side and 2+ on the left side.      Achilles reflexes are 2+ on the right side and 2+ on the left side. Skin: Skin is warm and intact. Capillary refill takes less than 2 seconds.  Nursing note and vitals reviewed.  ED Treatments / Results  Labs (all labs ordered are listed, but only abnormal results are displayed) Labs Reviewed - No data to display  EKG None  Radiology No results found.  Procedures Procedures (including critical care time)  Medications Ordered in ED Medications  ketorolac (TORADOL) 30 MG/ML injection 60 mg (60 mg Intramuscular Given 03/21/18 1929)  cyclobenzaprine (FLEXERIL) tablet 10 mg (10 mg Oral Given 03/21/18 1931)     Initial Impression / Assessment and Plan / ED Course  Triage vital signs and the nursing notes have been reviewed.  Pertinent labs & imaging results that were available during care of the patient were reviewed and considered in medical decision making (see chart for details).   Patient presents for low back pain with symptoms consistent with sciatica. Patient states that he did not ambulate today because of severe 10/10 pain with leg extension. He is found in the exam room laying on the floor with his left leg bent on a chair because that is most comfortable and provides relief for him. Fortunately, there is no midline or spinous process tenderness or recent trauma, falls or injuries that warrants spinal imaging today. He has no neuro complaints, bowel/bladder dysfunction or neuro deficits on exam that raise additional concern for acute spinal cord pathology. Despite pain, patient has good ROM of back and lower extremities bilaterally. There are no other s/s that raise concern of infectious or rheumatologic process.  Final Clinical Impressions(s) / ED Diagnoses  1. Low Back Pain with Left Sided Sciatica. IM Toradol 60mg  x1 given in the ED. Prescribed Deltasone 20mg  x5 days. Education provided on OTC and supportive treatment for pain  relief. 2.  Dispo: Home. After thorough clinical evaluation, this patient is determined to be medically stable and can be safely discharged with the previously mentioned treatment and/or outpatient follow-up/referral(s). At this time, there are no other apparent medical conditions that require further screening, evaluation or treatment.   Final diagnoses:  Acute left-sided low back pain with left-sided sciatica    ED Discharge Orders         Ordered    predniSONE (DELTASONE) 10 MG tablet  Daily     03/21/18 1947            Reva BoresMortis, Gabrielle I, PA-C 03/22/18 1719    Terrilee FilesButler, Michael C, MD 03/23/18 1350

## 2018-03-23 ENCOUNTER — Emergency Department (HOSPITAL_BASED_OUTPATIENT_CLINIC_OR_DEPARTMENT_OTHER)
Admission: EM | Admit: 2018-03-23 | Discharge: 2018-03-23 | Disposition: A | Discharge status: Home / Self Care | Payer: Self-pay | Attending: Emergency Medicine | Admitting: Emergency Medicine

## 2018-03-23 ENCOUNTER — Other Ambulatory Visit: Payer: Self-pay

## 2018-03-23 ENCOUNTER — Encounter (HOSPITAL_BASED_OUTPATIENT_CLINIC_OR_DEPARTMENT_OTHER): Payer: Self-pay | Admitting: *Deleted

## 2018-03-23 DIAGNOSIS — M5432 Sciatica, left side: Secondary | ICD-10-CM | POA: Insufficient documentation

## 2018-03-23 DIAGNOSIS — Z79899 Other long term (current) drug therapy: Secondary | ICD-10-CM | POA: Insufficient documentation

## 2018-03-23 DIAGNOSIS — F17200 Nicotine dependence, unspecified, uncomplicated: Secondary | ICD-10-CM | POA: Insufficient documentation

## 2018-03-23 MED ORDER — ONDANSETRON 8 MG PO TBDP
8.0000 mg | ORAL_TABLET | Freq: Once | ORAL | Status: AC
  Administered 2018-03-23: 8 mg via ORAL
  Filled 2018-03-23: qty 1

## 2018-03-23 MED ORDER — HYDROMORPHONE HCL 1 MG/ML IJ SOLN
2.0000 mg | Freq: Once | INTRAMUSCULAR | Status: AC
  Administered 2018-03-23: 2 mg via INTRAMUSCULAR
  Filled 2018-03-23: qty 2

## 2018-03-23 MED ORDER — OXYCODONE-ACETAMINOPHEN 5-325 MG PO TABS: 1.0000 | ORAL_TABLET | Freq: Four times a day (QID) | ORAL | 0 refills | Status: AC | PRN

## 2018-03-23 NOTE — ED Provider Notes (Signed)
[facility] Provider Note: Lowella DellJ. Lane Eathon Valade, MD, FACEP  CSN: [id number] MRN: [id number] ARRIVAL: 03/23/18 at 2100 ROOM: MH05/[facility]05   CHIEF COMPLAINT  Back Pain   HISTORY OF PRESENT ILLNESS  03/23/18 11:06 PM Frank Tyler is a 52 y.o. male with several weeks of left sided back pain radiating to his left leg.  It is been acutely worse for the past week and he now describes it as severe.  He has been seen in the ED twice already and prescribed NSAIDs, prednisone and Flexeril.  He has not been given any narcotics.  He returns with pain which she rates as a 10 out of 10.  He feels it down the back of his leg radiating around to his left lateral lower leg.  He is having paresthesias in this pattern.  Pain is worse with movement of the left hip and he has difficulty finding comfortable position.  Denies bowel or bladder changes.   History reviewed. No pertinent past medical history.  Past Surgical History:  Procedure Laterality Date  . HERNIA REPAIR     RIH  . HERNIA REPAIR     as a child    History reviewed. No pertinent family history.  Social History   Tobacco Use  . Smoking status: Current Some Day Smoker    Packs/day: 0.50  . Smokeless tobacco: Current User  Substance Use Topics  . Alcohol use: Yes    Comment: social  . Drug use: Yes    Types: Marijuana    Prior to Admission medications   Medication Sig Start Date End Date Taking? Authorizing Provider  gabapentin (NEURONTIN) 300 MG capsule 1 cap PO qday x 1 week, 1 cap PO BID x 1 week, then 1 cap PO TID 09/27/11   Azalia Bilisampos, Kevin, MD  methocarbamol (ROBAXIN) 500 MG tablet Take 1 tablet (500 mg total) by mouth 2 (two) times daily. 03/13/18   Harlene SaltsMorelli, Brandon A, PA-C  naproxen (NAPROSYN) 500 MG tablet Take 1 tablet (500 mg total) by mouth 2 (two) times daily. 03/13/18   Harlene SaltsMorelli, Brandon A, PA-C  predniSONE (DELTASONE) 10 MG tablet Take 2 tablets (20 mg total) by mouth daily for 5 days. 03/21/18 03/26/18  Mortis,  Jerrel IvoryGabrielle I, PA-C    Allergies Penicillins   REVIEW OF SYSTEMS  Negative except as noted here or in the History of Present Illness.   PHYSICAL EXAMINATION  Initial Vital Signs Blood pressure (!) 152/113, pulse (!) 103, temperature 98.2 F (36.8 C), resp. rate 18, height 5\' 10"  (1.778 m), weight 90.7 kg, SpO2 98 %.  Examination General: Well-developed, well-nourished male in no acute distress; appearance consistent with age of record HENT: normocephalic; atraumatic Eyes: pupils equal, round and reactive to light; extraocular muscles intact Neck: supple Heart: regular rate and rhythm Lungs: clear to auscultation bilaterally Abdomen: soft; nondistended; nontender; bowel sounds present Back: Left paralumbar tenderness with pain on movement of left hip Extremities: No deformity; decreased range of motion left hip Neurologic: Awake, alert and oriented; motor function intact in all extremities and symmetric except examination limited in left lower extremity due to pain; altered sensation lateral left lower leg; no facial droop Skin: Warm and dry Psychiatric: Normal mood and affect   RESULTS  Summary of this visit's results, reviewed by myself:   EKG Interpretation  Date/Time:    Ventricular Rate:    PR Interval:    QRS Duration:   QT Interval:    QTC Calculation:   R Axis:  Text Interpretation:        Laboratory Studies: No results found for this or any previous visit (from the past 24 hour(s)). Imaging Studies: No results found.  ED COURSE and MDM  Nursing notes and initial vitals signs, including pulse oximetry, reviewed.  Vitals:   03/23/18 2108 03/23/18 2111  BP: (!) 152/113   Pulse: (!) 103   Resp: 18   Temp: 98.2 F (36.8 C)   SpO2: 98%   Weight:  90.7 kg  Height:  5\' 10"  (1.778 m)   Symptoms consistent with sciatica.  We will treat him with a short course of narcotics, as he has had no narcotic prescriptions in the past 2 years, and refer to sports  medicine.   PROCEDURES    ED DIAGNOSES     ICD-10-CM   1. Sciatica of left side M54.32        Cyrus Ramsburg, MD 03/23/18 2315

## 2018-03-23 NOTE — ED Notes (Signed)
Left buttock pain that radiates down leg, states he has been taking the meds prescribed, but that it is not helping. Pt denies groin numbness, denies incontinence

## 2018-03-23 NOTE — ED Notes (Signed)
Pt verbalizes understanding of d/c instructions and denies any further need at this time. 

## 2018-03-23 NOTE — ED Triage Notes (Signed)
Back pain continues x 3 weeks. Seen for same

## 2018-03-27 ENCOUNTER — Ambulatory Visit: Payer: Self-pay | Admitting: Family Medicine

## 2018-03-29 ENCOUNTER — Encounter: Payer: Self-pay | Admitting: Family Medicine

## 2018-03-29 ENCOUNTER — Ambulatory Visit (INDEPENDENT_AMBULATORY_CARE_PROVIDER_SITE_OTHER): Payer: Self-pay | Admitting: Family Medicine

## 2018-03-29 VITALS — BP 125/82 | HR 88 | Ht 71.0 in | Wt 200.0 lb

## 2018-03-29 DIAGNOSIS — M5416 Radiculopathy, lumbar region: Secondary | ICD-10-CM

## 2018-03-29 MED ORDER — DICLOFENAC SODIUM 75 MG PO TBEC: 75.0000 mg | DELAYED_RELEASE_TABLET | Freq: Two times a day (BID) | ORAL | 1 refills | Status: DC

## 2018-03-29 MED ORDER — TIZANIDINE HCL 4 MG PO TABS: 4.0000 mg | ORAL_TABLET | Freq: Three times a day (TID) | ORAL | 1 refills | Status: AC | PRN

## 2018-03-29 NOTE — Patient Instructions (Signed)
You have lumbar radiculopathy (a pinched nerve in your low back). Ok to take tylenol for baseline pain relief (1-2 extra strength tabs 3x/day) Diclofenac 75mg  twice a day with food for pain and inflammation. Tizanidine as needed for muscle spasms (no driving on this medicine if it makes you sleepy). Stay as active as possible. Physical therapy has been shown to be helpful as well once out of the very painful phase. Strengthening of low back muscles, abdominal musculature are key for long term pain relief. Call me asap when the [facility] Coverage goes through so we can go ahead with the MRI of your lumbar spine. I suspect you're going to need steroid injections to get relief.

## 2018-04-02 ENCOUNTER — Telehealth: Payer: Self-pay | Admitting: Family Medicine

## 2018-04-02 MED ORDER — GABAPENTIN 300 MG PO CAPS: 300.0000 mg | ORAL_CAPSULE | Freq: Three times a day (TID) | ORAL | 1 refills | Status: DC

## 2018-04-02 NOTE — Telephone Encounter (Signed)
Patient was informed. [facility]. Patient was told to seek emergency care for developments of any of the listed problems below, he verbalized understanding.   Patient would like the gabapentin Rx.  Preferred pharmacy: [facility] on [address]

## 2018-04-02 NOTE — Telephone Encounter (Signed)
Sent in gabapentin to take three times a day.  Thanks!

## 2018-04-02 NOTE — Telephone Encounter (Signed)
He may be able to ask to be on a cancellation list if they have one.  He could also contact [facility] to see if they have some kind of financial assistance program and if he can get in sooner there.    We could add something like gabapentin 300mg  three times a day (looks like he's been on that before).  If he develops problems with bowel, bladder function, numbness in genital region, weakness in both legs, he should seek care in the emergency department.    Unfortunately there isn't anything we can do as an outpatient to accelerate his coverage then outpatient MRI.

## 2018-04-02 NOTE — Telephone Encounter (Signed)
Patient called to set up appointment to receive financial assistance, but the first available is September 18th. Patient is concerned to wait until then for MRI. States he is sleeping better with the medication but can barley walk or stand up

## 2018-04-02 NOTE — Progress Notes (Signed)
PCP: Patient, No Pcp Per  Subjective:   HPI: Patient is a 52 y.o. male here for left leg pain.  Patient reports for about 1.5  Months now he's had fairly severe pain from buttocks radiating into left foot. Associated numbness and tingling in same distribution. Tried massage, icing. Also given prednisone without much benefit. Flexeril, robaxin have not helped. Percocet caused him to be 'wired' and he was flushed. Better with pillow between legs. Pain level 10/10 and sharp. Cannot get comfortable or sleep due to pain. No bowel/bladder dysfunction.  History reviewed. No pertinent past medical history.  Current Outpatient Medications on File Prior to Visit  Medication Sig Dispense Refill  . gabapentin (NEURONTIN) 300 MG capsule 1 cap PO qday x 1 week, 1 cap PO BID x 1 week, then 1 cap PO TID 120 capsule 1  . methocarbamol (ROBAXIN) 500 MG tablet Take 1 tablet (500 mg total) by mouth 2 (two) times daily. 10 tablet 0  . naproxen (NAPROSYN) 500 MG tablet Take 1 tablet (500 mg total) by mouth 2 (two) times daily. 14 tablet 0  . oxyCODONE-acetaminophen (PERCOCET) 5-325 MG tablet Take 1 tablet by mouth every 6 (six) hours as needed for severe pain. 20 tablet 0   No current facility-administered medications on file prior to visit.     Past Surgical History:  Procedure Laterality Date  . HERNIA REPAIR     RIH  . HERNIA REPAIR     as a child    Allergies  Allergen Reactions  . Penicillins     Social History   Socioeconomic History  . Marital status: Married    Spouse name: Not on file  . Number of children: Not on file  . Years of education: Not on file  . Highest education level: Not on file  Occupational History  . Not on file  Social Needs  . Financial resource strain: Not on file  . Food insecurity:    Worry: Not on file    Inability: Not on file  . Transportation needs:    Medical: Not on file    Non-medical: Not on file  Tobacco Use  . Smoking status: Current  Some Day Smoker    Packs/day: 0.50  . Smokeless tobacco: Current User  Substance and Sexual Activity  . Alcohol use: Yes    Comment: social  . Drug use: Yes    Types: Marijuana  . Sexual activity: Not on file  Lifestyle  . Physical activity:    Days per week: Not on file    Minutes per session: Not on file  . Stress: Not on file  Relationships  . Social connections:    Talks on phone: Not on file    Gets together: Not on file    Attends religious service: Not on file    Active member of club or organization: Not on file    Attends meetings of clubs or organizations: Not on file    Relationship status: Not on file  . Intimate partner violence:    Fear of current or ex partner: Not on file    Emotionally abused: Not on file    Physically abused: Not on file    Forced sexual activity: Not on file  Other Topics Concern  . Not on file  Social History Narrative  . Not on file    History reviewed. No pertinent family history.  BP 125/82   Pulse 88   Ht 5\' 11"  (1.803 m)  Wt 200 lb (90.7 kg)   BMI 27.89 kg/m   Review of Systems: See HPI above.     Objective:  Physical Exam:  Gen: NAD, comfortable in exam room  Back: No gross deformity, scoliosis. TTP left buttock, lumbar paraspinal region.  No midline or bony TTP. FROM. Strength LEs 5/5 all muscle groups. 2+ MSRs in patellar and achilles tendons, equal bilaterally. Positive SLR and crossover SLR. Sensation diminished throughout left leg.  Left hip: No deformity. FROM with 5/5 strength. Tenderness over posterior hip, gluteal region.  No other tenderness. Sensation diminished to light touch. Negative logroll Negative fabers and piriformis stretches.   Assessment & Plan:  1. Lumbar radiculopathy - unfortunately he's not improved with extensive conservative treatment to date including prednisone, otc NSAIDs, muscle relaxants, oxycodone.  He will get [facility] coverage and then plan to go ahead with MRI of lumbar  spine.  Diclofenac twice a day with tizanidine as needed.

## 2018-04-03 NOTE — Telephone Encounter (Signed)
Patient informed. 

## 2018-04-10 ENCOUNTER — Telehealth: Payer: Self-pay | Admitting: Family Medicine

## 2018-04-10 NOTE — Telephone Encounter (Signed)
Patient was informed. He would like to start taking 2 tablets three times a day of the gabapentin. States he still has plenty tablets left but will call once he is close to being out of the medication.

## 2018-04-10 NOTE — Telephone Encounter (Signed)
He can bear weight as tolerated.  Most nerve (and orthopedic issues for that matter) tend to be worse at night.  If he's tolerating the gabapentin we can increase to 2 tabs three times a day.  I'd ask him to call to follow-up with [facility] again if he hasn't already since he hasn't heard back.  2 weeks is still a long time when you're having nerve pain.  Thanks!

## 2018-04-10 NOTE — Telephone Encounter (Signed)
Patient wants to know if he should be limiting getting up and moving around or try and bear weight on his left side when possible?  Patient states the gabapentin is helping during the day but not much at night. Patient asking if increased pain at night is normal?   Patient has an appointment with financial assistance on 9/18, he tried to contact [place] but has not received a return call

## 2018-04-11 NOTE — Telephone Encounter (Signed)
Noted, thanks.  Once he does, if I'm on vacation ok to send this in - it would be 180 tabs with 1 refill.

## 2018-04-25 ENCOUNTER — Ambulatory Visit: Payer: Self-pay

## 2018-05-29 ENCOUNTER — Ambulatory Visit: Payer: Self-pay

## 2018-06-13 ENCOUNTER — Other Ambulatory Visit: Payer: Self-pay | Admitting: Family Medicine

## 2018-06-14 ENCOUNTER — Other Ambulatory Visit: Payer: Self-pay | Admitting: Family Medicine

## 2018-06-29 ENCOUNTER — Other Ambulatory Visit: Payer: Self-pay | Admitting: Family Medicine

## 2018-08-07 ENCOUNTER — Other Ambulatory Visit: Payer: Self-pay | Admitting: Family Medicine

## 2018-08-09 ENCOUNTER — Emergency Department (HOSPITAL_BASED_OUTPATIENT_CLINIC_OR_DEPARTMENT_OTHER): Payer: Self-pay

## 2018-08-09 ENCOUNTER — Encounter (HOSPITAL_BASED_OUTPATIENT_CLINIC_OR_DEPARTMENT_OTHER): Payer: Self-pay

## 2018-08-09 ENCOUNTER — Other Ambulatory Visit: Payer: Self-pay

## 2018-08-09 ENCOUNTER — Emergency Department (HOSPITAL_BASED_OUTPATIENT_CLINIC_OR_DEPARTMENT_OTHER)
Admission: EM | Admit: 2018-08-09 | Discharge: 2018-08-10 | Disposition: A | Discharge status: Home / Self Care | Payer: Self-pay | Attending: Emergency Medicine | Admitting: Emergency Medicine

## 2018-08-09 DIAGNOSIS — F17200 Nicotine dependence, unspecified, uncomplicated: Secondary | ICD-10-CM | POA: Insufficient documentation

## 2018-08-09 DIAGNOSIS — Z79899 Other long term (current) drug therapy: Secondary | ICD-10-CM | POA: Insufficient documentation

## 2018-08-09 DIAGNOSIS — M79605 Pain in left leg: Secondary | ICD-10-CM

## 2018-08-09 DIAGNOSIS — M79662 Pain in left lower leg: Secondary | ICD-10-CM | POA: Insufficient documentation

## 2018-08-09 NOTE — ED Provider Notes (Signed)
[facility] EMERGENCY DEPARTMENT Provider Note   CSN: [id number] Arrival date & time: 08/09/18  1959     History   Chief Complaint Chief Complaint  Patient presents with  . Leg Pain    HPI Frank Tyler is a 53 y.o. male.  53 y.o male with a PMH of sciatica presents to the ED with a chief complaint of left leg pain x 5 months. He reports being seen by Orthopedist and having a prescription for oxycodone, dilaudid but reports not improvement on his symptoms. He reports the pain used to begin on his left buttock and radiated down his leg but since Monday the pain has now began at his left knee and radiated to his calf. He reports the pain is worse with walking and worse with sitting on his left buttock. He also reports some decreased sensation to the left foot, however was ambulatory on arrival. He denies any previous history of blood clots, shortness of breath or trauma.      History reviewed. No pertinent past medical history.  There are no active problems to display for this patient.   Past Surgical History:  Procedure Laterality Date  . HERNIA REPAIR     RIH  . HERNIA REPAIR     as a child        Home Medications    Prior to Admission medications   Medication Sig Start Date End Date Taking? Authorizing Provider  diclofenac (VOLTAREN) 75 MG EC tablet TAKE 1 TABLET BY MOUTH TWICE A DAY 06/14/18   Hudnall, Azucena FallenShane R, MD  gabapentin (NEURONTIN) 300 MG capsule Take 2 tablets three (3) times a day. 06/13/18   Hudnall, Azucena FallenShane R, MD  methocarbamol (ROBAXIN) 500 MG tablet Take 1 tablet (500 mg total) by mouth 2 (two) times daily. 03/13/18   Harlene SaltsMorelli, Brandon A, PA-C  naproxen (NAPROSYN) 500 MG tablet Take 1 tablet (500 mg total) by mouth 2 (two) times daily. 03/13/18   Bill SalinasMorelli, Brandon A, PA-C  oxyCODONE-acetaminophen (PERCOCET) 5-325 MG tablet Take 1 tablet by mouth every 6 (six) hours as needed for severe pain. 03/23/18   Molpus, John, MD  tiZANidine (ZANAFLEX) 4 MG  tablet Take 1 tablet (4 mg total) by mouth every 8 (eight) hours as needed. 03/29/18   Lenda KelpHudnall, Shane R, MD    Family History No family history on file.  Social History Social History   Tobacco Use  . Smoking status: Current Some Day Smoker    Packs/day: 0.50  . Smokeless tobacco: Current User  Substance Use Topics  . Alcohol use: Yes    Comment: social  . Drug use: Yes    Types: Marijuana     Allergies   Penicillins   Review of Systems Review of Systems  Constitutional: Negative for fever.  HENT: Negative for sore throat.   Respiratory: Negative for shortness of breath.   Cardiovascular: Negative for leg swelling.  Gastrointestinal: Negative for abdominal pain, diarrhea, nausea and vomiting.  Musculoskeletal: Positive for myalgias. Negative for arthralgias.     Physical Exam Updated Vital Signs BP 126/89 (BP Location: Right Arm)   Pulse 70   Temp 98.7 F (37.1 C) (Oral)   Resp 16   Ht 5\' 10"  (1.778 m)   Wt 86.6 kg   SpO2 100%   BMI 27.41 kg/m   Physical Exam Vitals signs and nursing note reviewed.  Constitutional:      Appearance: He is well-developed.  HENT:     Head: Normocephalic and  atraumatic.  Eyes:     General: No scleral icterus.    Pupils: Pupils are equal, round, and reactive to light.  Neck:     Musculoskeletal: Normal range of motion.  Cardiovascular:     Heart sounds: Normal heart sounds.  Pulmonary:     Effort: Pulmonary effort is normal.     Breath sounds: Normal breath sounds. No wheezing.  Chest:     Chest wall: No tenderness.  Abdominal:     General: Bowel sounds are normal. There is no distension.     Palpations: Abdomen is soft.     Tenderness: There is no abdominal tenderness.  Musculoskeletal:        General: No deformity.     Left knee: He exhibits normal range of motion, no swelling, no effusion, no ecchymosis and no erythema.     Left ankle: He exhibits no swelling, no ecchymosis, no deformity and normal pulse.      Left lower leg: He exhibits tenderness. He exhibits no swelling.     Comments: Reports tenderness with palpation no swelling appreciated. Pulses present. Capillary refill intact. Strength is 5/5 with dorsiflexion and plantarflexion.   Skin:    General: Skin is warm and dry.  Neurological:     Mental Status: He is alert and oriented to person, place, and time.      ED Treatments / Results  Labs (all labs ordered are listed, but only abnormal results are displayed) Labs Reviewed - No data to display  EKG None  Radiology US Venous Img Lower Unilateral Left  Result Date: 08/09/2018 CLINICAL DATA:  Left calf pain for 6 months. EXAM: LEFT LOWER EXTREMITY VENOUS DOPPLER ULTRASOUND TECHNIQUE: Gray-scale sonography with graded compression, as well as color Doppler and duplex ultrasound were performed to evaluate the lower extremity deep venous systems from the level of the common femoral vein and including the common femoral, femoral, profunda femoral, popliteal and calf veins including the posterior tibial, peroneal and gastrocnemius veins when visible. The superficial great saphenous vein was also interrogated. Spectral Doppler was utilized to evaluate flow at rest and with distal augmentation maneuvers in the common femoral, femoral and popliteal veins. COMPARISON:  None. FINDINGS: Contralateral Common Femoral Vein: Respiratory phasicity is normal and symmetric with the symptomatic side. No evidence of thrombus. Normal compressibility. Common Femoral Vein: No evidence of thrombus. Normal compressibility, respiratory phasicity and response to augmentation. Saphenofemoral Junction: No evidence of thrombus. Normal compressibility and flow on color Doppler imaging. Profunda Femoral Vein: No evidence of thrombus. Normal compressibility and flow on color Doppler imaging. Femoral Vein: No evidence of thrombus. Normal compressibility, respiratory phasicity and response to augmentation. Popliteal Vein: No  evidence of thrombus. Normal compressibility, respiratory phasicity and response to augmentation. Calf Veins: No evidence of thrombus. Normal compressibility and flow on color Doppler imaging. Superficial Great Saphenous Vein: No evidence of thrombus. Normal compressibility. Venous Reflux:  None. Other Findings:  None. IMPRESSION: No evidence of left lower extremity deep venous thrombosis. Electronically Signed   By: Narda Rutherford M.D.   On: 08/09/2018 23:41    Procedures Procedures (including critical care time)  Medications Ordered in ED Medications - No data to display   Initial Impression / Assessment and Plan / ED Course  I have reviewed the triage vital signs and the nursing notes.  Pertinent labs & imaging results that were available during my care of the patient were reviewed by me and considered in my medical decision making (see chart for details).  Sounds with left leg pain along with calf tenderness which began couple weeks ago.  He was seen 5 months ago due to some left sciatic nerve pain but reports this is different in symptoms.  He has tried his gabapentin but reports no relieving symptoms at this time.  Reports the pain is worse with ambulation along with sitting on his left buttocks.  Concerned about a spot on his left leg which he thinks might be a blood clot.  Ultrasound was ordered to rule out any DVT, negative for any clot at this time. Patient has good strength and the rest of his exam is reasurring. Will see Dr. Pearletha Forge to follow up with his sciatic pain. Return precautions provided.   Final Clinical Impressions(s) / ED Diagnoses   Final diagnoses:  Left leg pain    ED Discharge Orders         Ordered    US Venous Img Lower Unilateral Left  Status:  Canceled     08/09/18 2220           Claude Manges, PA-C 08/09/18 2354    Raeford Razor, MD 08/10/18 [phone]

## 2018-08-09 NOTE — Discharge Instructions (Signed)
Your Ultrasound today was negative. Please continue to take your gabapentin as prescribed by your orthopedist. If you experience any worsening symptoms, shortness of breath please return to the ED.

## 2018-08-09 NOTE — ED Triage Notes (Signed)
Pt c/o hx of sciatica-c/o pain to left buttock with numbness down left LE with a "knot" to calf-NAD-steady gait

## 2019-09-16 IMAGING — US US EXTREM LOW VENOUS*L*
1 series · 13 of 24 positions shown · non-contrast
Comparison: None.

CLINICAL DATA: Left calf pain for 6 months.



[Series 1: us extrem low venous*left* · 0.08mm/px · 13 of 32 slices shown]
[im 1/32]
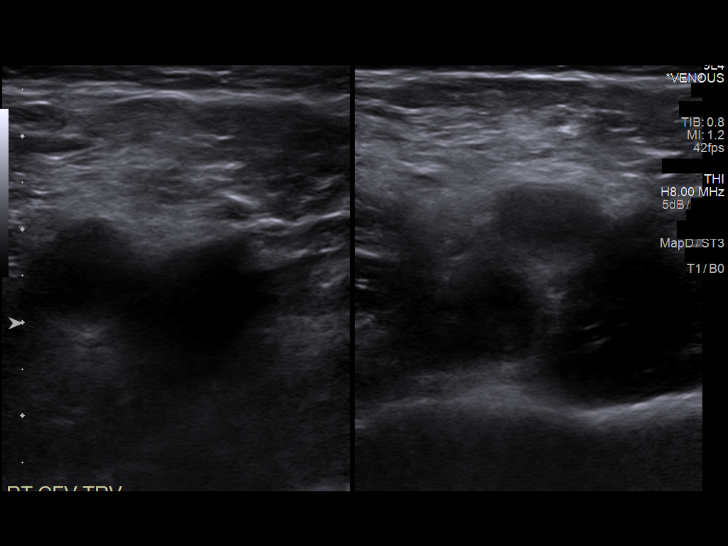
[im 3/32]
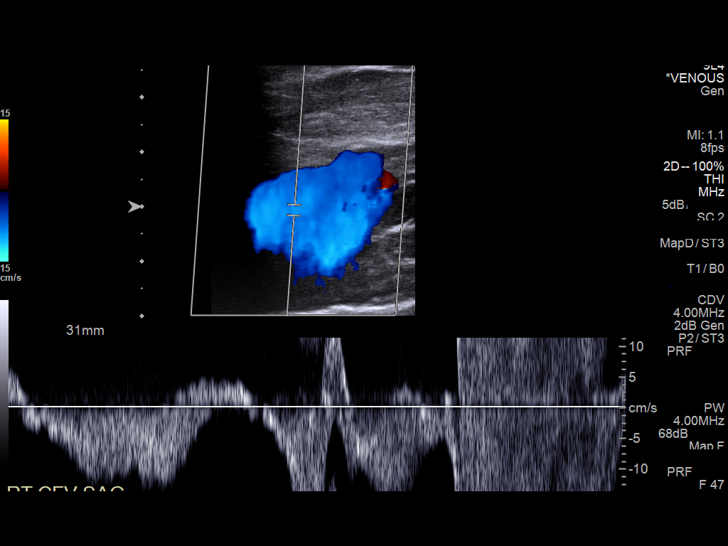
[im 6/32]
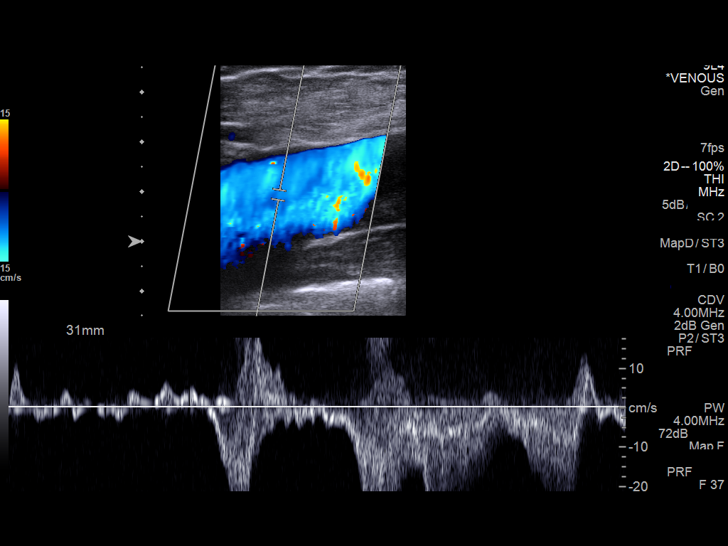
[im 9/32]
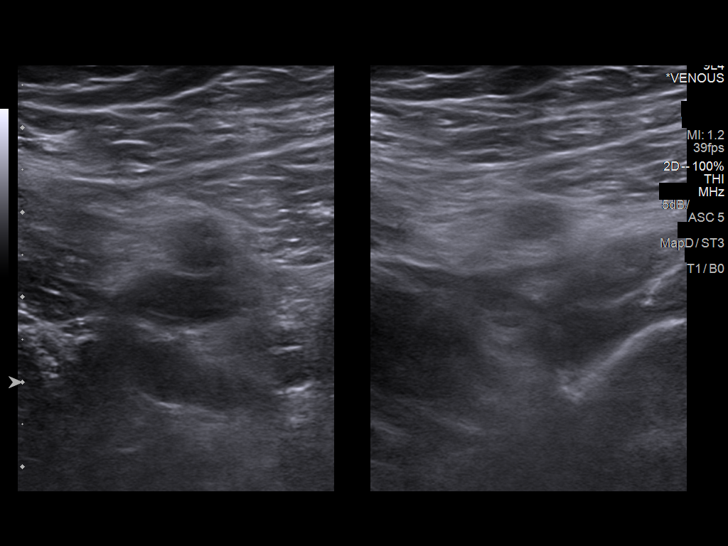
[im 11/32]
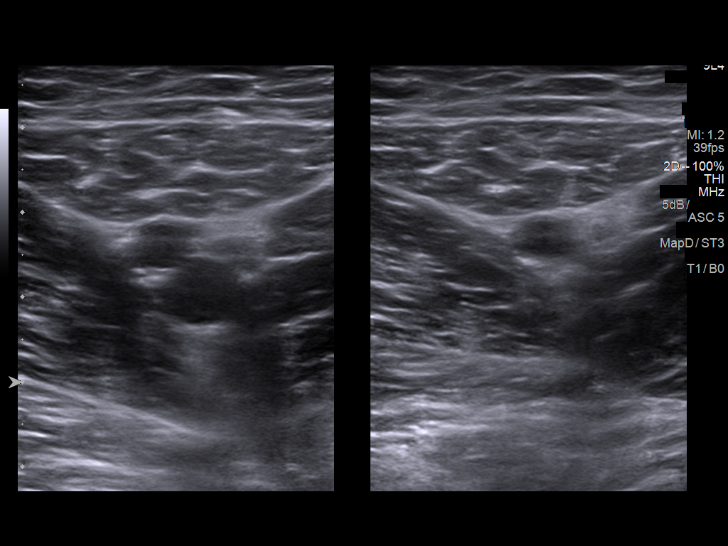
[im 14/32]
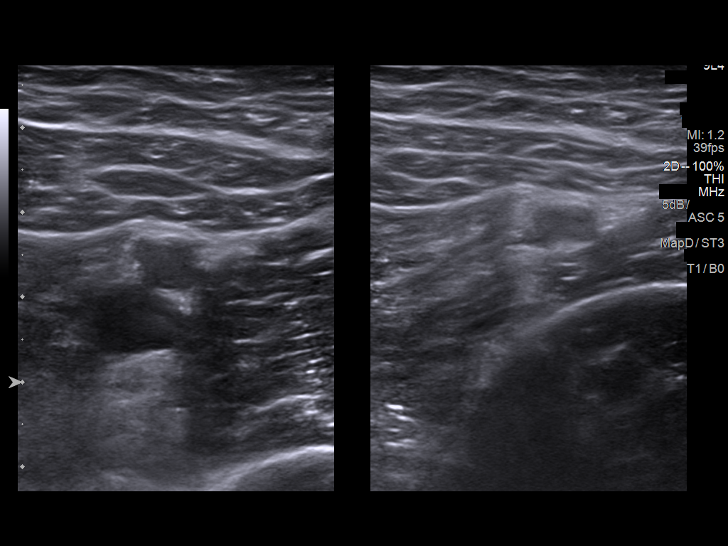
[im 17/32]
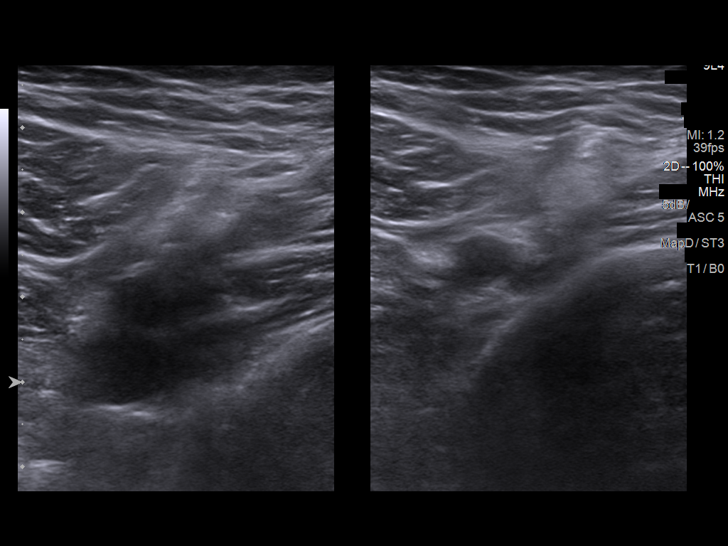
[im 18/32]
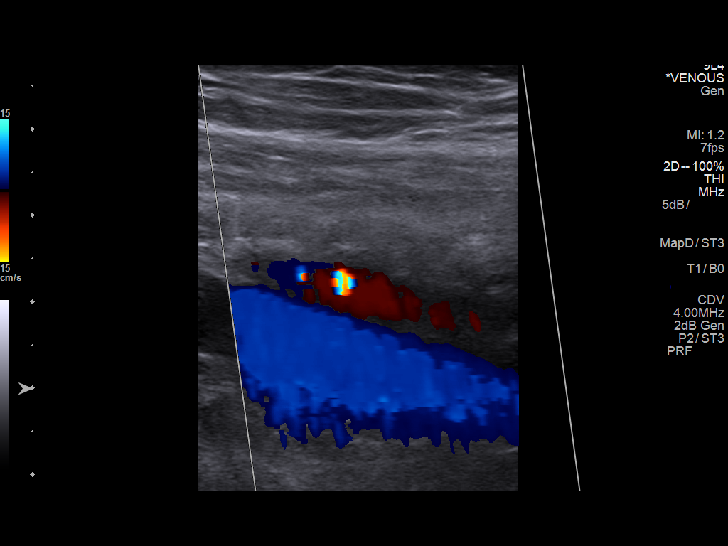
[im 21/32]
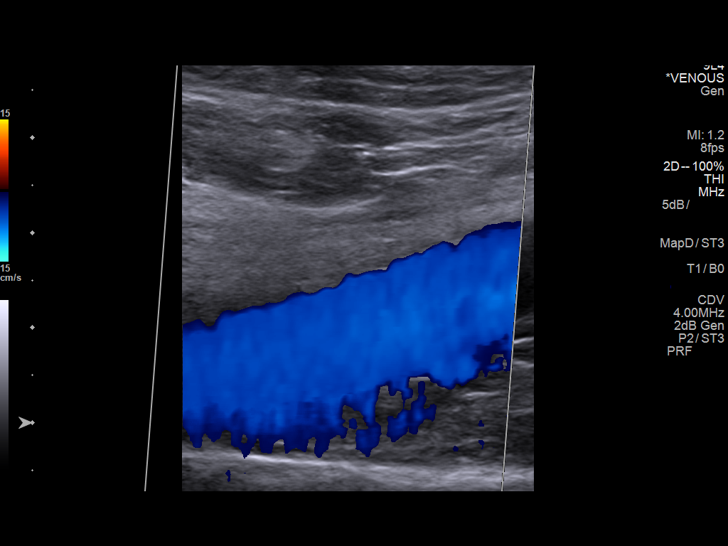
[im 23/32]
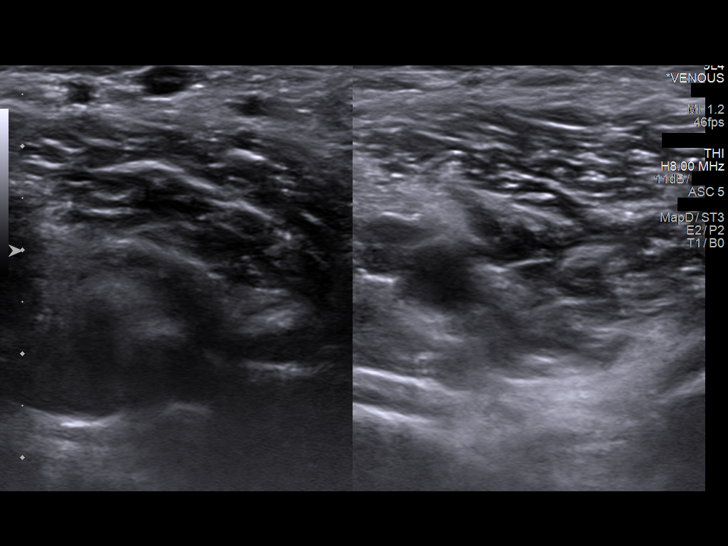
[im 26/32]
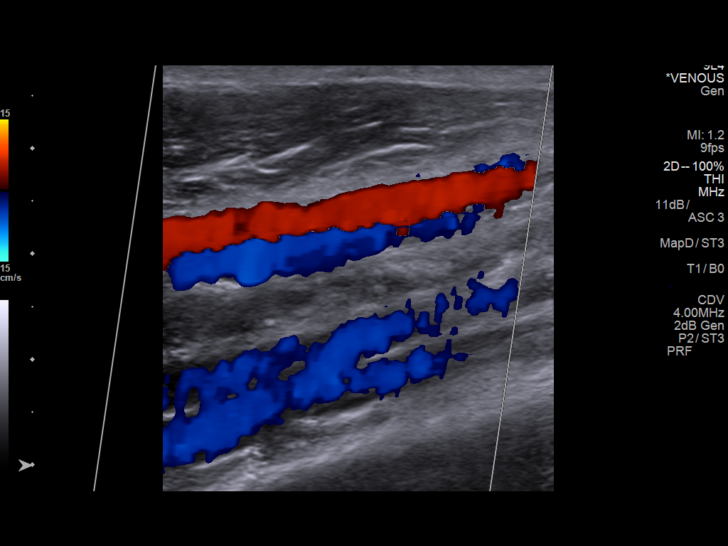
[im 29/32]
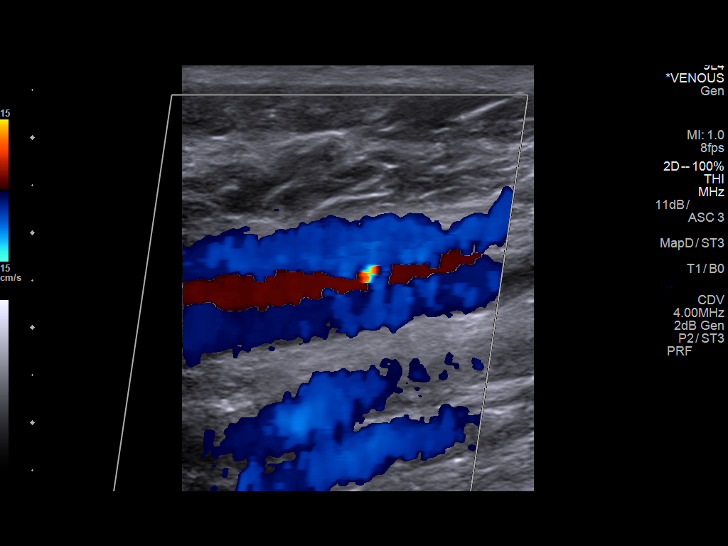
[im 32/32]
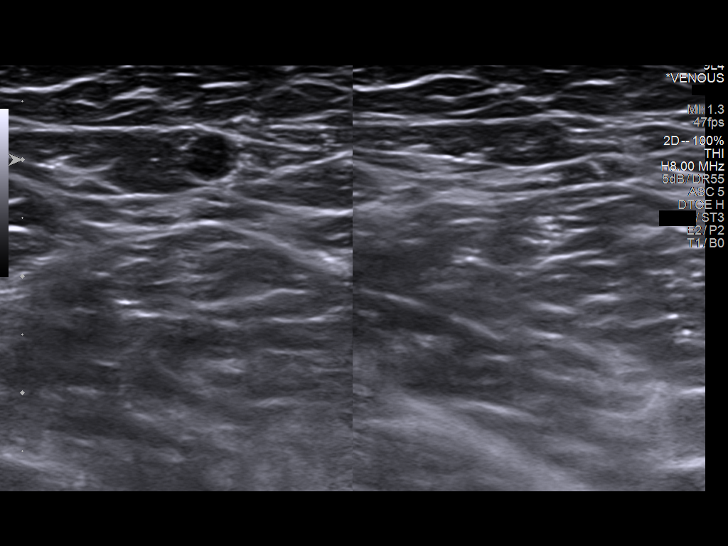

[13 of 24 positions shown; findings below may reference images not displayed]

FINDINGS: Contralateral Common Femoral Vein: Respiratory phasicity is normal
and symmetric with the symptomatic side. No evidence of thrombus.
Normal compressibility.

Common Femoral Vein: No evidence of thrombus. Normal
compressibility, respiratory phasicity and response to augmentation.

Saphenofemoral Junction: No evidence of thrombus. Normal
compressibility and flow on color Doppler imaging.

Profunda Femoral Vein: No evidence of thrombus. Normal
compressibility and flow on color Doppler imaging.

Femoral Vein: No evidence of thrombus. Normal compressibility,
respiratory phasicity and response to augmentation.

Popliteal Vein: No evidence of thrombus. Normal compressibility,
respiratory phasicity and response to augmentation.

Calf Veins: No evidence of thrombus. Normal compressibility and flow
on color Doppler imaging.

Superficial Great Saphenous Vein: No evidence of thrombus. Normal
compressibility.

Venous Reflux:  None.

Other Findings:  None.
IMPRESSION: No evidence of left lower extremity deep venous thrombosis.

## 2021-11-19 ENCOUNTER — Other Ambulatory Visit: Payer: Self-pay

## 2021-11-19 ENCOUNTER — Emergency Department (HOSPITAL_BASED_OUTPATIENT_CLINIC_OR_DEPARTMENT_OTHER)
Admission: EM | Admit: 2021-11-19 | Discharge: 2021-11-19 | Disposition: A | Discharge status: Home / Self Care | Payer: Self-pay | Attending: Emergency Medicine | Admitting: Emergency Medicine

## 2021-11-19 ENCOUNTER — Encounter (HOSPITAL_BASED_OUTPATIENT_CLINIC_OR_DEPARTMENT_OTHER): Payer: Self-pay

## 2021-11-19 DIAGNOSIS — R8271 Bacteriuria: Secondary | ICD-10-CM | POA: Insufficient documentation

## 2021-11-19 DIAGNOSIS — N410 Acute prostatitis: Secondary | ICD-10-CM | POA: Insufficient documentation

## 2021-11-19 LAB — CBC WITH DIFFERENTIAL/PLATELET
Abs Immature Granulocytes: 0.02 10*3/uL (ref 0.00–0.07)
Basophils Absolute: 0.1 10*3/uL (ref 0.0–0.1)
Basophils Relative: 1 %
Eosinophils Absolute: 0.1 10*3/uL (ref 0.0–0.5)
Eosinophils Relative: 2 %
HCT: 51.6 % (ref 39.0–52.0)
Hemoglobin: 18.4 g/dL — ABNORMAL HIGH (ref 13.0–17.0)
Immature Granulocytes: 0 %
Lymphocytes Relative: 25 %
Lymphs Abs: 1.9 10*3/uL (ref 0.7–4.0)
MCH: 32.2 pg (ref 26.0–34.0)
MCHC: 35.7 g/dL (ref 30.0–36.0)
MCV: 90.4 fL (ref 80.0–100.0)
Monocytes Absolute: 0.8 10*3/uL (ref 0.1–1.0)
Monocytes Relative: 10 %
Neutro Abs: 4.7 10*3/uL (ref 1.7–7.7)
Neutrophils Relative %: 62 %
Platelets: 253 10*3/uL (ref 150–400)
RBC: 5.71 MIL/uL (ref 4.22–5.81)
RDW: 11.9 % (ref 11.5–15.5)
WBC: 7.5 10*3/uL (ref 4.0–10.5)
nRBC: 0 % (ref 0.0–0.2)

## 2021-11-19 LAB — COMPREHENSIVE METABOLIC PANEL
ALT: 34 U/L (ref 0–44)
AST: 27 U/L (ref 15–41)
Albumin: 4.3 g/dL (ref 3.5–5.0)
Alkaline Phosphatase: 62 U/L (ref 38–126)
Anion gap: 5 (ref 5–15)
BUN: 17 mg/dL (ref 6–20)
CO2: 24 mmol/L (ref 22–32)
Calcium: 9.1 mg/dL (ref 8.9–10.3)
Chloride: 107 mmol/L (ref 98–111)
Creatinine, Ser: 0.99 mg/dL (ref 0.61–1.24)
GFR, Estimated: >60 mL/min (ref >60)
Glucose, Bld: 96 mg/dL (ref 70–99)
Potassium: 4.3 mmol/L (ref 3.5–5.1)
Sodium: 136 mmol/L (ref 135–145)
Total Bilirubin: 0.8 mg/dL (ref 0.3–1.2)
Total Protein: 7.6 g/dL (ref 6.5–8.1)

## 2021-11-19 LAB — URINALYSIS, ROUTINE W REFLEX MICROSCOPIC
Bilirubin Urine: NEGATIVE
Glucose, UA: >=500 mg/dL — AB
Hgb urine dipstick: NEGATIVE
Ketones, ur: NEGATIVE mg/dL
Leukocytes,Ua: NEGATIVE
Nitrite: NEGATIVE
Protein, ur: NEGATIVE mg/dL
Specific Gravity, Urine: 1.025 (ref 1.005–1.030)
pH: 5.5 (ref 5.0–8.0)

## 2021-11-19 LAB — URINALYSIS, MICROSCOPIC (REFLEX)

## 2021-11-19 LAB — LIPASE, BLOOD: Lipase: 36 U/L (ref 11–51)

## 2021-11-19 MED ORDER — SULFAMETHOXAZOLE-TRIMETHOPRIM 800-160 MG PO TABS: 1.0000 | ORAL_TABLET | Freq: Two times a day (BID) | ORAL | 0 refills | Status: AC

## 2021-11-19 NOTE — Discharge Instructions (Signed)
Given your normal labs, symptoms and your previous history of prostatitis, I do suspect that your symptoms are likely from another recurrence of prostatitis.  I have sent in a prescription of an antibiotic for you that you will take for 28 days.  If your symptoms do not improve after that, I'd like you to follow-up with the provided urologist or return to the ED for evaluation. ?

## 2021-11-19 NOTE — ED Triage Notes (Signed)
Pt arrives with c/o pain to lower abdomen X10 days reports history of prostate issues and hernia mesh.  ?

## 2021-11-19 NOTE — ED Provider Notes (Signed)
?[facility] ?Provider Note ? ? ?CSN: [id number] ?Arrival date & time: 11/19/21  1040 ? ?  ? ?History ? ?Chief Complaint  ?Patient presents with  ? Abdominal Pain  ? ? ?Frank Tyler is a 56 y.o. male with history of prostatitis and abdominal surgeries to include right-sided hernia repair with mesh presents to the ED for evaluation of lower abdominal pain that started 10 days ago.  Patient notes that the pain feels "deep", closer to his rectum that actually in his abdomen.  He endorses increased urinary frequency without dysuria or hematuria.  He notes that he has had prostatitis a few times in the past and this feels similar to previous flareups.  No treatment prior to arrival.  Denies bowel movement difficulty.  He denies fever, chills, nausea, vomiting, diarrhea. ? ? ?Abdominal Pain ? ?  ? ?Home Medications ?Prior to Admission medications   ?Medication Sig Start Date End Date Taking? Authorizing Provider  ?sulfamethoxazole-trimethoprim (BACTRIM DS) 800-160 MG tablet Take 1 tablet by mouth 2 (two) times daily for 28 days. 11/19/21 12/17/21 Yes Kathe Becton R, PA-C  ?diclofenac (VOLTAREN) 75 MG EC tablet TAKE 1 TABLET BY MOUTH TWICE A DAY 06/14/18   Hudnall, Sharyn Lull, MD  ?gabapentin (NEURONTIN) 300 MG capsule Take 2 tablets three (3) times a day. 06/13/18   Hudnall, Sharyn Lull, MD  ?methocarbamol (ROBAXIN) 500 MG tablet Take 1 tablet (500 mg total) by mouth 2 (two) times daily. 03/13/18   Nuala Alpha A, PA-C  ?naproxen (NAPROSYN) 500 MG tablet Take 1 tablet (500 mg total) by mouth 2 (two) times daily. 03/13/18   Nuala Alpha A, PA-C  ?oxyCODONE-acetaminophen (PERCOCET) 5-325 MG tablet Take 1 tablet by mouth every 6 (six) hours as needed for severe pain. 03/23/18   Molpus, John, MD  ?tiZANidine (ZANAFLEX) 4 MG tablet Take 1 tablet (4 mg total) by mouth every 8 (eight) hours as needed. 03/29/18   Dene Gentry, MD  ?   ? ?Allergies    ?Penicillins   ? ?Review of Systems   ?Review  of Systems  ?Gastrointestinal:  Positive for abdominal pain.  ? ?Physical Exam ?Updated Vital Signs ?BP (!) 128/91   Pulse 73   Temp 98.1 ?F (36.7 ?C) (Oral)   Resp 18   Ht 5\' 11"  (1.803 m)   Wt 91.6 kg   SpO2 98%   BMI 28.17 kg/m?  ?Physical Exam ?Vitals and nursing note reviewed.  ?Constitutional:   ?   General: He is not in acute distress. ?   Appearance: He is not ill-appearing.  ?HENT:  ?   Head: Atraumatic.  ?Eyes:  ?   Conjunctiva/sclera: Conjunctivae normal.  ?Cardiovascular:  ?   Rate and Rhythm: Normal rate and regular rhythm.  ?   Pulses: Normal pulses.  ?   Heart sounds: No murmur heard. ?Pulmonary:  ?   Effort: Pulmonary effort is normal. No respiratory distress.  ?   Breath sounds: Normal breath sounds.  ?Abdominal:  ?   General: Abdomen is flat. There is no distension.  ?   Palpations: Abdomen is soft.  ?   Tenderness: There is no abdominal tenderness.  ?   Comments: Abdomen is soft, nondistended, nontender to palpation in all quadrants.  When asked where he feels this pain, patient presses in  deep to his suprapubic region and reports that it is "close to his buttocks".  ?Musculoskeletal:     ?   General: Normal range of motion.  ?  Cervical back: Normal range of motion.  ?Skin: ?   General: Skin is warm and dry.  ?   Capillary Refill: Capillary refill takes less than 2 seconds.  ?Neurological:  ?   General: No focal deficit present.  ?   Mental Status: He is alert.  ?Psychiatric:     ?   Mood and Affect: Mood normal.  ? ? ?ED Results / Procedures / Treatments   ?Labs ?(all labs ordered are listed, but only abnormal results are displayed) ?Labs Reviewed  ?CBC WITH DIFFERENTIAL/PLATELET - Abnormal; Notable for the following components:  ?    Result Value  ? Hemoglobin 18.4 (*)   ? All other components within normal limits  ?URINALYSIS, ROUTINE W REFLEX MICROSCOPIC - Abnormal; Notable for the following components:  ? Glucose, UA >=500 (*)   ? All other components within normal limits   ?URINALYSIS, MICROSCOPIC (REFLEX) - Abnormal; Notable for the following components:  ? Bacteria, UA RARE (*)   ? All other components within normal limits  ?LIPASE, BLOOD  ?COMPREHENSIVE METABOLIC PANEL  ? ? ?EKG ?None ? ?Radiology ?No results found. ? ?Procedures ?Procedures  ? ? ?Medications Ordered in ED ?Medications - No data to display ? ?ED Course/ Medical Decision Making/ A&P ?  ?                        ?Medical Decision Making ?Amount and/or Complexity of Data Reviewed ?Labs: ordered. ? ?Risk ?Prescription drug management. ? ? ?History:  ?Per HPI ?Social determinants of health: None ? ?Initial impression: ? ?This patient presents to the ED for concern of lower abdominal pain, this involves an extensive number of treatment options, and is a complaint that carries with it a high risk of complications and morbidity.    ? ? ?Lab Tests and EKG: ? ?I Ordered, reviewed, and interpreted labs and EKG.  The pertinent results include:  ?CMP, lipase normal ?CBC without leukocytosis ?UA with bacteria, no squamous indicating contamination ? ?ED Course: ?55 year old male in no acute distress, nontoxic-appearing presents to the ED for evaluation of lower abdominal pain.  Vitals are normal.  Physical exam overall benign.  No acute findings.  Labs normal, UA with bacteria without evidence of contamination.  Given patient's clinical symptoms, history and labs, EKG is having another flareup of acute prostatitis.  Patient notes that symptoms feel exactly the same as previous episodes.  Will treat with Bactrim x28 days with urology follow-up. Advised to return if symptoms do not improve or worsen.  ? ?Disposition: ? ?After consideration of the diagnostic results, physical exam, history and the patients response to treatment feel that the patent would benefit from discharge.   ?Acute prostatitis: Plan and management as described above. Discharged home in good condition. ? ?Final Clinical Impression(s) / ED Diagnoses ?Final  diagnoses:  ?Acute prostatitis  ? ? ?Rx / DC Orders ?ED Discharge Orders   ? ?      Ordered  ?  sulfamethoxazole-trimethoprim (BACTRIM DS) 800-160 MG tablet  2 times daily       ? 11/19/21 1214  ? ?  ?  ? ?  ? ? ?  ?Tonye Pearson, [place] ?11/19/21 1516 ? ?  ?Hayden Rasmussen, MD ?11/19/21 1842 ? ?
